# Patient Record
Sex: Male | Born: 1935 | Race: White | Hispanic: No | Marital: Married | Smoking: Never smoker
Health system: Southern US, Community
[De-identification: ages and names within clinical notes are randomized; demographics above are authoritative.]

## PROBLEM LIST (undated history)

## (undated) DIAGNOSIS — N12 Tubulo-interstitial nephritis, not specified as acute or chronic: Secondary | ICD-10-CM

## (undated) DIAGNOSIS — I639 Cerebral infarction, unspecified: Secondary | ICD-10-CM

## (undated) DIAGNOSIS — I2510 Atherosclerotic heart disease of native coronary artery without angina pectoris: Secondary | ICD-10-CM

## (undated) DIAGNOSIS — E785 Hyperlipidemia, unspecified: Secondary | ICD-10-CM

## (undated) DIAGNOSIS — C329 Malignant neoplasm of larynx, unspecified: Secondary | ICD-10-CM

## (undated) DIAGNOSIS — I10 Essential (primary) hypertension: Secondary | ICD-10-CM

## (undated) DIAGNOSIS — I358 Other nonrheumatic aortic valve disorders: Secondary | ICD-10-CM

## (undated) HISTORY — DX: Other nonrheumatic aortic valve disorders: I35.8

## (undated) HISTORY — DX: Hyperlipidemia, unspecified: E78.5

## (undated) HISTORY — DX: Atherosclerotic heart disease of native coronary artery without angina pectoris: I25.10

## (undated) HISTORY — PX: OTHER SURGICAL HISTORY: SHX169

## (undated) HISTORY — PX: CHOLECYSTECTOMY: SHX55

## (undated) HISTORY — DX: Tubulo-interstitial nephritis, not specified as acute or chronic: N12

## (undated) HISTORY — DX: Malignant neoplasm of larynx, unspecified: C32.9

## (undated) HISTORY — DX: Essential (primary) hypertension: I10

---

## 2001-02-15 ENCOUNTER — Encounter: Payer: Self-pay | Admitting: Internal Medicine

## 2001-02-15 ENCOUNTER — Ambulatory Visit (HOSPITAL_COMMUNITY): Admission: RE | Admit: 2001-02-15 | Discharge: 2001-02-15 | Payer: Self-pay | Admitting: Internal Medicine

## 2002-08-16 ENCOUNTER — Ambulatory Visit (HOSPITAL_COMMUNITY): Admission: RE | Admit: 2002-08-16 | Discharge: 2002-08-16 | Payer: Self-pay | Admitting: Internal Medicine

## 2002-08-16 ENCOUNTER — Encounter: Payer: Self-pay | Admitting: Internal Medicine

## 2002-08-18 ENCOUNTER — Encounter (HOSPITAL_COMMUNITY): Admission: RE | Admit: 2002-08-18 | Discharge: 2002-09-17 | Payer: Self-pay | Admitting: Internal Medicine

## 2002-08-18 ENCOUNTER — Encounter: Payer: Self-pay | Admitting: Internal Medicine

## 2002-10-20 ENCOUNTER — Encounter: Payer: Self-pay | Admitting: Internal Medicine

## 2002-10-20 ENCOUNTER — Ambulatory Visit (HOSPITAL_COMMUNITY): Admission: RE | Admit: 2002-10-20 | Discharge: 2002-10-20 | Payer: Self-pay | Admitting: Internal Medicine

## 2002-11-02 ENCOUNTER — Ambulatory Visit (HOSPITAL_COMMUNITY): Admission: RE | Admit: 2002-11-02 | Discharge: 2002-11-02 | Payer: Self-pay | Admitting: Internal Medicine

## 2003-05-15 ENCOUNTER — Encounter: Payer: Self-pay | Admitting: Emergency Medicine

## 2003-05-15 ENCOUNTER — Emergency Department (HOSPITAL_COMMUNITY): Admission: EM | Admit: 2003-05-15 | Discharge: 2003-05-15 | Payer: Self-pay | Admitting: Emergency Medicine

## 2003-10-19 ENCOUNTER — Encounter (HOSPITAL_COMMUNITY): Admission: RE | Admit: 2003-10-19 | Discharge: 2003-11-18 | Payer: Self-pay | Admitting: Cardiology

## 2003-10-22 ENCOUNTER — Ambulatory Visit (HOSPITAL_COMMUNITY): Admission: RE | Admit: 2003-10-22 | Discharge: 2003-10-22 | Payer: Self-pay | Admitting: Internal Medicine

## 2003-10-23 ENCOUNTER — Ambulatory Visit (HOSPITAL_COMMUNITY): Admission: RE | Admit: 2003-10-23 | Discharge: 2003-10-23 | Payer: Self-pay | Admitting: Internal Medicine

## 2003-11-14 ENCOUNTER — Observation Stay (HOSPITAL_COMMUNITY): Admission: RE | Admit: 2003-11-14 | Discharge: 2003-11-15 | Payer: Self-pay | Admitting: Internal Medicine

## 2006-09-14 DIAGNOSIS — I2510 Atherosclerotic heart disease of native coronary artery without angina pectoris: Secondary | ICD-10-CM

## 2006-09-14 HISTORY — DX: Atherosclerotic heart disease of native coronary artery without angina pectoris: I25.10

## 2007-07-19 ENCOUNTER — Ambulatory Visit: Payer: Self-pay | Admitting: Cardiology

## 2007-07-19 ENCOUNTER — Ambulatory Visit: Payer: Self-pay | Admitting: Cardiovascular Disease

## 2007-07-19 ENCOUNTER — Inpatient Hospital Stay (HOSPITAL_COMMUNITY): Admission: EM | Admit: 2007-07-19 | Discharge: 2007-07-21 | Payer: Self-pay | Admitting: Cardiovascular Disease

## 2007-07-20 ENCOUNTER — Encounter: Payer: Self-pay | Admitting: Cardiovascular Disease

## 2007-08-04 ENCOUNTER — Ambulatory Visit: Payer: Self-pay | Admitting: Cardiology

## 2007-08-25 ENCOUNTER — Encounter (HOSPITAL_COMMUNITY): Admission: RE | Admit: 2007-08-25 | Discharge: 2007-09-14 | Payer: Self-pay

## 2007-09-16 ENCOUNTER — Encounter (HOSPITAL_COMMUNITY): Admission: RE | Admit: 2007-09-16 | Discharge: 2007-10-16 | Payer: Self-pay | Admitting: Cardiology

## 2007-10-17 ENCOUNTER — Encounter (HOSPITAL_COMMUNITY): Admission: RE | Admit: 2007-10-17 | Discharge: 2007-11-16 | Payer: Self-pay | Admitting: Cardiology

## 2007-11-08 ENCOUNTER — Ambulatory Visit: Payer: Self-pay | Admitting: Cardiology

## 2007-11-17 ENCOUNTER — Encounter (HOSPITAL_COMMUNITY): Admission: RE | Admit: 2007-11-17 | Discharge: 2007-12-17 | Payer: Self-pay | Admitting: Cardiology

## 2008-01-31 ENCOUNTER — Ambulatory Visit: Payer: Self-pay | Admitting: Cardiology

## 2008-02-01 ENCOUNTER — Encounter: Payer: Self-pay | Admitting: Orthopedic Surgery

## 2008-02-01 ENCOUNTER — Ambulatory Visit (HOSPITAL_COMMUNITY): Admission: RE | Admit: 2008-02-01 | Discharge: 2008-02-01 | Payer: Self-pay | Admitting: Pulmonary Disease

## 2008-04-09 ENCOUNTER — Encounter: Payer: Self-pay | Admitting: Orthopedic Surgery

## 2008-04-09 ENCOUNTER — Ambulatory Visit (HOSPITAL_COMMUNITY): Admission: RE | Admit: 2008-04-09 | Discharge: 2008-04-09 | Payer: Self-pay | Admitting: Pulmonary Disease

## 2008-04-17 ENCOUNTER — Encounter: Payer: Self-pay | Admitting: Orthopedic Surgery

## 2008-04-26 ENCOUNTER — Ambulatory Visit: Payer: Self-pay | Admitting: Orthopedic Surgery

## 2008-04-26 DIAGNOSIS — M1710 Unilateral primary osteoarthritis, unspecified knee: Secondary | ICD-10-CM | POA: Insufficient documentation

## 2008-04-26 DIAGNOSIS — M23302 Other meniscus derangements, unspecified lateral meniscus, unspecified knee: Secondary | ICD-10-CM | POA: Insufficient documentation

## 2008-04-26 DIAGNOSIS — M25569 Pain in unspecified knee: Secondary | ICD-10-CM | POA: Insufficient documentation

## 2008-05-10 ENCOUNTER — Ambulatory Visit: Payer: Self-pay | Admitting: Orthopedic Surgery

## 2008-08-10 ENCOUNTER — Ambulatory Visit: Payer: Self-pay | Admitting: Cardiology

## 2008-09-10 ENCOUNTER — Encounter: Payer: Self-pay | Admitting: Cardiology

## 2008-09-10 LAB — CONVERTED CEMR LAB
ALT: 15 units/L
AST: 30 units/L
Alkaline Phosphatase: 60 units/L
CO2: 27 meq/L
Cholesterol: 170 mg/dL
HDL: 44 mg/dL
HDL: 44 mg/dL
LDL Cholesterol: 86 mg/dL
TSH: 1.7 microintl units/mL
Total Bilirubin: 0.8 mg/dL
Triglyceride fasting, serum: 150 mg/dL
Triglyceride fasting, serum: 150 mg/dL

## 2009-01-21 ENCOUNTER — Ambulatory Visit: Payer: Self-pay | Admitting: Cardiology

## 2009-01-22 ENCOUNTER — Encounter: Payer: Self-pay | Admitting: Cardiology

## 2009-01-22 LAB — CONVERTED CEMR LAB
Albumin: 4.4 g/dL
Alkaline Phosphatase: 60 units/L
BUN: 12 mg/dL
CO2: 27 meq/L
Creatinine, Ser: 0.85 mg/dL
MCV: 94 fL
Platelets: 212 10*3/uL
Total Bilirubin: 0.8 mg/dL
WBC: 6.8 10*3/uL

## 2009-01-24 ENCOUNTER — Ambulatory Visit (HOSPITAL_COMMUNITY): Admission: RE | Admit: 2009-01-24 | Discharge: 2009-01-24 | Payer: Self-pay | Admitting: Cardiology

## 2009-02-19 ENCOUNTER — Ambulatory Visit: Payer: Self-pay | Admitting: Cardiology

## 2009-02-19 ENCOUNTER — Encounter: Payer: Self-pay | Admitting: Cardiology

## 2009-05-21 ENCOUNTER — Ambulatory Visit (HOSPITAL_COMMUNITY): Admission: RE | Admit: 2009-05-21 | Discharge: 2009-05-21 | Payer: Self-pay | Admitting: Otolaryngology

## 2009-05-30 ENCOUNTER — Ambulatory Visit: Admission: RE | Admit: 2009-05-30 | Discharge: 2009-08-22 | Payer: Self-pay | Admitting: Radiation Oncology

## 2010-01-01 ENCOUNTER — Ambulatory Visit: Payer: Self-pay | Admitting: Orthopedic Surgery

## 2010-03-04 ENCOUNTER — Encounter: Admission: RE | Admit: 2010-03-04 | Discharge: 2010-03-04 | Payer: Self-pay | Admitting: General Surgery

## 2010-03-04 ENCOUNTER — Other Ambulatory Visit: Admission: RE | Admit: 2010-03-04 | Discharge: 2010-03-04 | Payer: Self-pay | Admitting: Interventional Radiology

## 2010-03-14 ENCOUNTER — Telehealth (INDEPENDENT_AMBULATORY_CARE_PROVIDER_SITE_OTHER): Payer: Self-pay

## 2010-04-14 ENCOUNTER — Ambulatory Visit: Payer: Self-pay | Admitting: Cardiology

## 2010-04-14 DIAGNOSIS — I2510 Atherosclerotic heart disease of native coronary artery without angina pectoris: Secondary | ICD-10-CM | POA: Insufficient documentation

## 2010-04-14 DIAGNOSIS — E785 Hyperlipidemia, unspecified: Secondary | ICD-10-CM

## 2010-10-14 NOTE — Progress Notes (Signed)
Summary: Refills  Phone Note Outgoing Call   Call placed by: Larita Fife Via LPN,  March 14, 1609 2:41 PM Action Taken: Appt scheduled Details for Reason: Refills Summary of Call: Pt. is due for f/u and needs refills. Pt. made an appt. for 04-14-10 with Dr. Dietrich Pates. RX for Isosorbide 30 day supply sent to [facility]. Initial call taken by: Larita Fife Via LPN,  March 15, 9603 2:43 PM

## 2010-10-14 NOTE — Assessment & Plan Note (Signed)
Summary: due for 1 yr f/u/tg   Visit Type:  Follow-up Primary Provider:  Dr.Edward [name]   History of Present Illness: [name] returns to the office for continuing assessment and treatment of coronary disease and cardiovascular risk factors.  Since I saw him one year ago, has done quite well from a cardiovascular standpoint.  Blood pressure typically runs approximately 150/80.  Lipids have not recently been assessed.  He has had no chest pain nor dyspnea.  Unfortunately, he was evaluated by Dr. 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  The primary tumor was excised and postoperative radiation treatments completed.  MRI Brain  Procedure date:  01/24/2009  Findings:      No acute abnormality Minor bilateral carotid atherosclerosis No significant abnormalities in the intracranial vessels  CXR  Procedure date:  07/29/2007  Findings:      Mild cardiomegaly Clear lung fields  Echocardiogram  Procedure date:  07/20/2007  Findings:      LV-normal size and function; mild LVH AV-mild thickening MV-mild regurgitation; mild annular calcification  -  Date:  09/10/2008    Cholesterol: 170    LDL: 86    HDL: 44    Triglycerides: 914    TSH: 1.7    BG Random: 36    BUN: 12    Creatinine: 0.85    Sodium: 140    Potassium: 4.4    Chloride: 101    CO2 Total: 27    SGOT (AST): 30    SGPT (ALT): 15    T. Bilirubin: 0.8    Alk Phos: 60    Calcium: 9.3    Total Protein: 7.2    Albumin: 4.4   Current Medications (verified): 1)  Aspirin Ec 325 Mg Tbec (Aspirin) .... Take One Tablet By Mouth Two Times A Day 2)  Isosorbide Dinitrate 10 Mg Tabs (Isosorbide Dinitrate) .... Take One  Tablet  By Mouth Three Times A Day 3)  Tylenol Arthritis Pain 650 Mg Cr-Tabs (Acetaminophen) .... As Needed 4)  Zantac 150 Mg Tabs (Ranitidine Hcl) .... Take 1 Tab Daily 5)  Potassium 99 Mg Tabs (Potassium) .... Take 1 Tab  Daily 6)  Daily Multi  Tabs (Multiple Vitamins-Minerals) .... Take 1 Tab Daily  Allergies (verified): 1)  ! * Ciprofloxacin 2)  ! * Pravastatin  Past History:  PMH, FH, and Social History reviewed and updated.  Past Medical History: ASCVD: Non-Q MI in 11/08; 40% mid LAD; 60% ostial diagonal; 40% RCA; 50% PDA; 95% posterolateral-DES Hypertension Hyperlipidemia Aortic valve sclerosis History of pyelonephritis Laryngeal carcinoma  Past Surgical History: Cholecystectomy-2005 Resection of laryngeal carcinoma-2010  Family History: Father died due to myocardial infarction at age 6 Mother died at age 6 following hip fracture; history of diabetes Siblings: One brother with CVA and MI; second required CABG surgery; third suffered myocardial infarction. 4 of 6 sisters are alive; one died due to leukemia and one unknown  Social History: Married with one adult child  Retired  Statistician excessive use  Review of Systems       See history of present illness.  Vital Signs:  Patient profile:   77 year old male Weight:      203 pounds BMI:     34.97 Pulse rate:   71 / minute BP sitting:   162 / 82  (right arm)  Vitals Entered By: Eddie Saa, CNA (April 14, 2010 2:45 PM)  Physical Exam  General:  Mildly overweight; well developed; no acute distress; mildly hoarse voice Weight-203, 10 pounds less than last year Neck-No JVD; no carotid bruits: Lungs-No tachypnea, no rales; no rhonchi; no wheezes: Cardiovascular-normal PMI; normal S1 and S2; fourth heart sound present Abdomen-BS normal; soft and non-tender without masses or organomegaly:  Musculoskeletal-No deformities, no cyanosis or clubbing: Neurologic-Normal cranial nerves; symmetric strength and tone:  Skin-Warm, no significant lesions: Extremities-Nl distal pulses; no edema:     Impression & Recommendations:  Problem # 1:  ATHEROSCLEROTIC CARDIOVASCULAR DISEASE (ICD-429.2) Patient is currently  asymptomatic.  In the absence of my ability to modify medical therapy, no additional intervention is warranted.  Problem # 2:  HYPERLIPIDEMIA (ICD-272.4) Patient has not tolerated lipid-lowering medications in the past and is unwilling to try any additional agents.  Problem # 3:  HYPERTENSION (ICD-401.1) Blood pressure is mildly elevated at this office visit and at home as well by patient report.  In the past, blood pressure on repeat measurement was improved, but not today.  He appears to have developed significant hypertension, but is unwilling to attempt pharmacologic treatment.  I emphasized the importance of seeking medical attention immediately should recurrent symptoms suggestive of myocardial ischemia occur.  Since Mr. Kollmann is unwilling to modify his medical regime, management of his coronary disease has only one option-percutaneous or surgical intervention should additional ischemia or infarction occur.

## 2010-10-14 NOTE — Assessment & Plan Note (Signed)
Summary: CHECK LT KNEE/MEDICARE,MUT OF OM/CAF   Visit Type:  new problem  CC:  pain swelling and LEFT knee.  History of Present Illness: pain swelling, LEFT knee.  77 year old male with history of injury to his LEFT knee twisting while playing tennis several years ago. He received one injection from his primary care physician did seem to help.  He does complain of medial knee pain, pain with twisting, inability to squat.    history of cancer, treated with radiation.  Exam shows he does not have a limp.  His knee does not have an effusion. He has medial joint line pain. He has 125 of flexion with pain on terminal flexion is positive Murray. Sign with a click.  Neurovascular exam intact.  Injected LEFT knee.  Follow up as needed     Allergies: No Known Drug Allergies   Other Orders: Est. Patient Level II (44010) Depo- Medrol 40mg  (J1030) Joint Aspirate / Injection, Large (27253)

## 2010-11-17 ENCOUNTER — Telehealth (INDEPENDENT_AMBULATORY_CARE_PROVIDER_SITE_OTHER): Payer: Self-pay | Admitting: *Deleted

## 2010-11-25 NOTE — Progress Notes (Signed)
Summary: PT IS OUT OF MEDICATION NEEDS CALLED IN TODAY  Phone Note Call from Patient Call back at Home Phone [phone]   Caller: PT Reason for Call: Talk to Nurse Summary of Call: PT NEED ISOSORIBIDE CALLED IN TO [facility], [facility] STATES THAT THEY HAVE FAXED THIS TO USE TWICE. Initial call taken by: Faythe Ghee,  November 17, 2010 10:11 AM    Prescriptions: ISOSORBIDE DINITRATE 10 MG TABS (ISOSORBIDE DINITRATE) Take one  tablet  by mouth three times a day  #90 x 1   Entered by:   Teressa Lower RN   Authorized by:   Kathlen Brunswick, MD, [facility]   Signed by:   Teressa Lower RN on 11/17/2010   Method used:   Electronically to        [facility]  [address] (retail)       [address]       [place], [place]  [address]       Ph: [phone]       Fax: [phone]   RxID:   [id number]

## 2011-01-15 ENCOUNTER — Other Ambulatory Visit: Payer: Self-pay | Admitting: Cardiology

## 2011-01-27 NOTE — Letter (Signed)
February 19, 2009    Eddie L. [name], MD  [address]  [place], [place] [address]   RE:  [name], [name]  MRN:  [id number]  /  DOB:  08/31/1929   Dear [name]:   [name] returns to the office for continued assessment and treatment  of coronary artery disease and possible cerebrovascular disease.  Since  his last visit, he has done fine.  He took Aggrenox for a while, but  subsequently felt week with diarrhea and nausea prompting him to  discontinue that medication.  After all has been set and done, he is  back on the medicine that Dr. Pollyann Glen put him on in [place] decades  ago, specifically aspirin 325 mg b.i.d. and Sorbitrate 10 mg t.i.d.  He  feels that he is somewhat less energetic and more fatigued than he was  in the past, but he attributes this to aging.   His MRI/MRA study was quite good.  There were some minor atherosclerotic  changes in the carotids that were thought to be age appropriate.  He had  no evidence of previous CVA.   PHYSICAL EXAMINATION:  GENERAL:  Pleasant gentleman, somewhat apologetic  about stopping all of his medications, but in no acute distress.  VITAL SIGNS:  The weight is 213, 3 pounds more than in May.  Blood  pressure 110/80, heart rate 65 and regular, respirations 12 and  unlabored.  NECK:  No jugular venous distention; no carotid bruits.  LUNGS:  Clear.  CARDIAC:  Normal first and second heart sounds; fourth heart sound  present.  ABDOMEN:  Soft and nontender; no bruits.  EXTREMITIES:  No edema.   IMPRESSION:  [name] appears to be delivering the message that he  will tolerate no medication.  His lipid profile off medication is not  that bad with a total cholesterol of 170.  I have explained the  rationale for all these medicines and the fact that they do not provide  symptomatic benefit, but decrease his likelihood of future events.  I  have explained the importance that he seek immediate treatment when he  experiences symptoms that  could represent one of those events.  I will  plan to see this nice gentleman again in 1 year.    Sincerely,      Eddie Friends. Dietrich Pates, MD, [facility]  Electronically Signed    RMR/MedQ  DD: 02/19/2009  DT: 02/20/2009  Job #: [id number]

## 2011-01-27 NOTE — Cardiovascular Report (Signed)
[name], [name]              ACCOUNT NO.:  [id number]   MEDICAL RECORD NO.:  [id number]          PATIENT TYPE:  INP   LOCATION:  6529                         FACILITY:  [facility]   PHYSICIAN:  Veverly Fells. Excell Seltzer, MD  DATE OF BIRTH:  1929/04/02   DATE OF PROCEDURE:  07/19/2007  DATE OF DISCHARGE:                            CARDIAC CATHETERIZATION   PROCEDURE:  Left heart catheterization, selective coronary angiography,  left ventricular angiography, PTCA and drug-eluting stent placement in  the right posterolateral branch and StarClose of the right femoral  artery.   INDICATION:  [name] is a 77 year old gentleman who presented with an  acute coronary syndrome.  He ruled in for subendocardial MI.  He was  started on heparin and Integrilin early this morning but has continued  to have stuttering chest pain and was brought for cardiac  catheterization.   PROCEDURE IN DETAIL:  Risks and indications of the procedure were  reviewed with the patient.  Informed consent was obtained.  The right  groin was prepped, draped, anesthetized with 1% lidocaine using modified  Seldinger technique.  A 6 French sheath was placed in the right femoral  artery.  Multiple views of the left and right coronary arteries were  taken using standard 6 French Judkins catheters.  Following selective  coronary angiography an angled pigtail catheter was inserted into the  left ventricle and pressures were recorded.  Left ventriculogram was  performed and a pullback across the aortic valve was done.   At the conclusion of the diagnostic procedure I elected to intervene on  the right posterolateral branch.  This was a large branch with a 95-99%  stenosis.  Otherwise [name] had diffuse plaque and nonobstructive  disease but this was the only critical lesion seen.  Heparin and  Integrilin were continued for anticoagulation.  Additional 3000 units of  heparin was given and once a therapeutic ACT was achieved a  6 French RCV  guide catheter was inserted into the right coronary artery.  A Cougar  guidewire was passed easily beyond the area of severe stenosis out into  the distal posterolateral branch.  The lesion was predilated on multiple  inflations with a 2.5 x 12 mm Maverick balloon.  The balloon was well  expanded and appeared to cover the lesion well.  The balloon was dilated  to 8 atmospheres.  Following balloon dilatation there was improvement in  the percent stenosis and there was persistent TIMI III flow.  I elected  to stent the vessel with a 2.75 x 15 mm Promus stent which was deployed  at 16 atmospheres.  The stent was well expanded.  There was TIMI III  flow following stenting.  The stent was then postdilated with a 3.0 x 12  mm Quantum Maverick balloon to 20 atmospheres on two inflations.  This  covered the entire stented segment.  At completion of the procedure  there was zero percent residual stenosis and TIMI III flow throughout.  The stent was widely expanded.  A StarClose device was then used to seal  the femoral arteriotomy.  All catheter exchanges were  performed over a  guidewire.  There were no immediate complications.   FINDINGS:  Aortic pressure 137/72 with mean of 101, left ventricular  pressure is 137/22.   The left main coronary artery has very minor luminal irregularity.  It  is widely patent and bifurcates into the LAD and left circumflex.   The LAD is a large caliber vessel that courses down and wraps around the  left ventricular apex.  There is a large first diagonal branch and a  medium sized second diagonal branch present.  The proximal LAD has 30%  stenosis in the mid LAD at the origin of the first septal perforator and  first diagonal branch has a 30-40% stenosis.  The first diagonal branch  has a 60% ostial stenosis.  There is TIMI III flow throughout the LAD.  There are no other significant stenoses throughout the mid and distal  portions of the LAD.    The left circumflex is widely patent.  It is a large caliber vessel that  courses down the AV groove.  After the first OM the vessel tapers to a  medium size vessel.  There is no significant angiographic stenosis  throughout the left circumflex.  There is a tiny intermediate branch  with a dye stain that may be subtotally occluded.  This appears to be a  very small branch vessel.  The first OM branch is large caliber and  essentially supplies an OM1 and OM2 territory as it bifurcates into twin  vessels.  There are minor luminal irregularities but no significant  stenoses throughout the left circumflex or its branch vessels.   The right coronary artery is diffusely diseased.  It has an inferior  origin and the mid portion of the vessel has diffuse 30-40% stenoses.  After a medium sized RV marginal branch there are diffuse luminal  irregularities throughout the distal portion of the RCA.  The RCA  divides into a large posterolateral branch as well as a large PDA  branch.  The PDA has minor disease in the mid portion and approximately  50% stenosis.  The posterolateral branch has a severe 95% stenosis and  otherwise has minor luminal irregularities.  The 95% stenosis is located  in the mid portion of the posterolateral branch.   Left ventriculography.  The left ventricular function is poorly  visualized overall.  The base of the left ventricle appears normal.  The  apical portions are not well seen.  Overall the LV function appears to  be grossly normal.   ASSESSMENT:  1. Nonobstructive LAD (left anterior descending) and left circumflex      stenosis.  2. Severe right posterolateral stenosis with successful PCI      (percutaneous coronary intervention) using a Promus drug-eluting      stent.  3. Probable normal left ventricular function.   PLAN:  The patient should remain on dual antiplatelet therapy with  aspirin and Plavix for a minimum of 12 months.  Will check an  echocardiogram  to better assess his LV function.  We will cycle cardiac  enzymes in the setting of his non-ST MI and he should be treated with  aggressive medical therapy in the setting of his diffuse coronary artery  disease.      Veverly Fells. Excell Seltzer, MD  Electronically Signed     MDC/MEDQ  D:  07/19/2007  T:  07/20/2007  Job:  [id number]

## 2011-01-27 NOTE — Letter (Signed)
November 08, 2007    [name], M.D.  [address]  [place], [place] [address]   RE:  [name], [name]  MRN:  [id number]  /  DOB:  1929-01-28   Dear [name]:   [name] returns to the office for continued assessment and treatment  of coronary disease and cardiovascular risk factors.  Since his last  visit, he has done generally well.  He reports symptoms consistent with  restless leg syndrome when he retires at night, but these do not disturb  his sleep.  He has some leg discomfort and feels that his extremities  are cold.  His exercise tolerance is good.  He has had no chest  discomfort nor dyspnea.   Current medications include clopidogrel 75 mg daily, metoprolol 25 mg  b.i.d., aspirin 325 mg daily, and pravastatin 20 mg daily.  He was  treated previously treated with isosorbide dinitrate and would like to  resume this medication.   EXAM:  Pleasant, well-appearing gentleman in no acute distress.  The weight is 206, 2 pounds more than in November 2008.  Blood pressure  120/70, heart rate 45 and regular, respirations 18.  NECK:  No jugular venous distention; normal carotid upstrokes without  bruits.  SKIN:  Maculopapular rash over the forehead.  LUNGS:  Clear.  CARDIAC:  Grade 2/6 - 3/6 systolic ejection murmur at the cardiac base.  ABDOMEN:  Soft and nontender; normal bowel sounds; no bruits; no  organomegaly.  EXTREMITIES:  No edema; normal distal pulses.   EKG:  Sinus bradycardia; prominent voltage; delayed R-wave progression;  nondiagnostic inferior Q-waves; J-point elevation; lateral T-wave  inversion more prominent than on a prior tracing of July 20, 2007.   IMPRESSION:  [name] is doing well from a symptomatic standpoint.  Blood pressure control is excellent.  Lipids were checked last month and  were also excellent.  He has asymptomatic sinus bradycardia.  Metoprolol  will be discontinued.  Isosorbide will be resumed at a dose of 10 mg  t.i.d.  He is  concerned about pravastatin.  The dose will be decreased  to 10 mg daily.  I will plan see this nice gentleman again in 7 months.    Sincerely,      Eddie Friends. Dietrich Pates, MD, [facility]  Electronically Signed    RMR/MedQ  DD: 11/08/2007  DT: 11/09/2007  Job #: [id number]

## 2011-01-27 NOTE — Assessment & Plan Note (Signed)
[name]                       Eddie CARDIOLOGY OFFICE NOTE   Warren, [name]                     MRN:          [id number]  DATE:01/21/2009                            DOB:          11/12/1928    CARDIOLOGIST:  Dr. Dietrich Pates.   PRIMARY CARE PHYSICIAN:  Dr. Shaune Pollack.   REASON FOR VISIT:  Six-month follow-up.   HISTORY OF PRESENT ILLNESS:  [name] is a 77 year old male patient  with a history of coronary disease status post non-ST-elevation  myocardial infarction November 2008 treated with a Promus drug-eluting  stent to the posterolateral branch of RCA who has residual moderate  nonobstructive disease.  I last saw him in November 2009.  The patient  complained of significant myalgias.  He has stopped his pravastatin.  He  also had stopped his Plavix, but I asked him to restart this.  He called  back several weeks later with continued symptoms of myalgias and  arthralgias with his Plavix.  After further review with the  cardiologist, I had the patient stop his Plavix altogether as it had  been more than 12 months since his intervention.  He had been asked to  increase his aspirin to 325 mg a day at that time.  He is generally done  well up until yesterday.  He denies any history of exertional chest pain  or shortness of breath.  He denies orthopnea, PND or pedal edema.  Last  night, he describes symptoms of right arm weakness and numbness with  associated speech difficulties.  His wife says that he would try to say  one thing, but something different was spoken.  He denied any visual  symptoms.  His symptoms lasted about 15 minutes and subsided on their  own.  Today, he feels well.   MEDICATIONS:  1. Multivitamin daily.  2. Aspirin 325 mg daily.  3. Isosorbide 5 mg 3 times a day 4 tablets p.r.n.  4. Nitroglycerin p.r.n.   ALLERGIES AND INTOLERANCES:  Zocor has called caused myalgias in the  past as well as pravastatin.  He has  had problems with significant  hypotension and side effects to lisinopril and Norvasc.  He is  apparently had significant bradycardia with beta-blockers in the past as  well.   SOCIAL HISTORY:  Is a nonsmoker.   REVIEW OF SYSTEMS:  Please see HPI.  Denies any fevers, chills, cough,  melena, hematochezia, hematuria, dysuria.  All other systems reviewed  and negative.   PHYSICAL EXAMINATION:  He is a well-nourished, well-developed male.  Blood pressure is 140/68, pulse 58, weight 210 pounds.  HEENT:  Normal.  NECK:  Without JVD.  LYMPH:  Without lymphadenopathy.  ENDOCRINE:  Without thyromegaly.  CARDIAC:  Normal S1-S2.  Regular rate and rhythm with a short 1/6  systolic ejection murmur heard best along the left sternal border.  LUNGS:  Clear to auscultation bilaterally.  No wheezing or rales.  ABDOMEN:  Soft, nontender.  EXTREMITIES:  Without edema.  NEUROLOGIC:  He is alert and oriented x3.  Cranial nerves II-XII grossly  intact.  SKIN:  Warm and dry.  VASCULAR  EXAM:  No carotid bruits noted bilaterally.   Electrocardiogram revealed sinus rhythm with a heart rate of 80, normal  axis, PACs.  No significant change when compared to previous tracing.   ASSESSMENT AND PLAN:  1. Transient neurologic symptoms concerning for transient ischemic      attack.  I discussed the case further with Dr. Dietrich Pates today.  We      will arrange MRI and MRA of his brain and neck.  Since he cannot      take Plavix, he will be placed on Aggrenox 1 tablet b.i.d.  In      addition, he will be asked to take 81 mg of aspirin for      antiplatelet effect given his history of PCI.  I will also be in      touch with Dr. Juanetta Warren regarding referral to neurology.  He will      have blood work to include a CBC, CMET and TSH.  He will be brought      back in several weeks to follow up with Dr. Dietrich Pates.  2. Dyslipidemia.  He requires further management of his lipids.      However, he has had significant side  effects in the past.  We will      proceed with workup of his recent possible TIA initially.  When he      is seen back in follow-up, we can certainly discuss possibly      placing him on low-dose Crestor 3 to 4 days a week.  3. Hypertension.  This needs better control.  He has been intolerant      to several medications in the past.  As noted above, he will      undergo further workup of possible TIA.  When he is seen back in      follow-up, we can certainly consider adding low-dose thiazide      diuretic to his regimen to control as blood pressure.  4. Coronary artery disease.  He is not having any symptoms of angina      at this time.   DISPOSITION:  He will be brought back in follow-up with Dr. Dietrich Pates in  3 to 4 weeks or sooner p.r.n.     [name], PA-C  Electronically Signed      Gerrit Friends. Dietrich Pates, MD, [facility]  Electronically Signed   SW/MedQ  DD: 01/21/2009  DT: 01/21/2009  Job #: [id number]   cc:   [name], M.D.

## 2011-01-27 NOTE — Discharge Summary (Signed)
NAMEJAXZEN, [name]              ACCOUNT NO.:  [id number]   MEDICAL RECORD NO.:  [id number]          PATIENT TYPE:  INP   LOCATION:  6529                         FACILITY:  [facility]   PHYSICIAN:  Gerrit Friends. Dietrich Pates, MD, FACCDATE OF BIRTH:  04-26-1929   DATE OF ADMISSION:  07/19/2007  DATE OF DISCHARGE:  07/21/2007                               DISCHARGE SUMMARY   PRIMARY CARDIOLOGIST:  Dr. Kari Baars   DISCHARGE DIAGNOSIS:  1. Non-ST-elevation myocardial infarction.  2. Coronary artery disease.  3. Hyperlipidemia  4. Status post cholecystectomy 2005.   ALLERGIES:  CIPROFLOXACIN.   PROCEDURES:  1. Left heart cardiac catheterization with successful percutaneous      coronary intervention and stenting of the right posterolateral vein      (RPLV) with placement of a 2.75 x 15-mm Promise drug-eluting stent.  2. A 2-D echocardiogram.   HISTORY OF PRESENT ILLNESS:  This is a 77 year old male with a prior  history of coronary artery disease, who has been medically managed and  doing well over the years.  He was in his usual state of health until  the afternoon of July 19, 2007, he began to note exertional chest  pressure, relieved with rest and subsequently with nitroglycerin.  Because of ongoing symptoms with progressive severity, he had presented  to [facility] where a first set of cardiac markers and ECG were  unremarkable.  He was transferred to [facility] for further evaluation.   HOSPITAL COURSE:  [name] ruled in for non-ST-elevation MI with a  peak CK of 383, MB of 51.7 and troponin I of 7.31.  He was placed on  Integrilin therapy and underwent left heart cardiac catheterization on  November 4, revealing a 95% stenosis in the proximal right  posterolateral branch of the right coronary artery.  He otherwise had  nonobstructive disease and normal LV function.  The RPL was successfully  stented with a 2.75 x 15-mm Promise drug-eluting stent.  The patient  tolerated this procedure well and postprocedure was monitored in the ICU  prior to being transferred to telemetry.  He underwent a 2-D  echocardiogram on November 5 and this showed an EF of 55% to 60% without  regional wall motion abnormalities.  There was mild mitral  regurgitation.  [name] has been ambulating without recurrent  symptoms or limitations.  He has been maintained on aspirin and beta  blocker and Plavix therapy.  Notably, [name] was previously on  simvastatin at home.  He is unsure of the dose.  Unfortunately, he had  myalgias with that dose of simvastatin and we have initiated Lipitor 80  mg nightly here.  However, we are concerned that he may not tolerate  this dose once at home.  We have, therefore, recommended to him to use  1/2 tablet of the simvastatin that he has at home and we will follow up  with him as an outpatient in [place].  [name] is otherwise doing  and well be discharged today in satisfactory condition.   DISCHARGE LABS:  Hemoglobin 13.4, hematocrit 38.6, WBC 7.2, platelets  183,000, MCV 96.6, sodium 38, potassium 3.5, chloride 100, CH 28, BUN  17, creatinine 0.94, glucose 77, INR 1.1, CK 164, MB 5.6.  His peak  troponin-I was 7.34, total bilirubin 1.6, alkaline phosphatase 57, AST  74, ALT 21, albumin 3.4, calcium 8.8, magnesium 1.9, total cholesterol  145, triglycerides 82, HDL 32, LDL 97, TSH 2.202.   DISPOSITION:  The patient is being discharged home today in good  condition.   FOLLOW-UP APPOINTMENTS:  1. He is to follow up with Dr. Mableton Bing on November 20 at 2:30      p.m..  2. He is to follow up with Dr. [name] as previously scheduled.   DISCHARGE MEDICATIONS:  1. Aspirin 325 mg daily.  2. Plavix 75 mg daily.  3. Simvastatin 1/2 tablet of his current home dose (he is unsure the      dose).  4. Lopressor 25 mg b.i.d.  5. Nitroglycerin 0.4 mg sublingual p.r.n. chest pain.   OUTSTANDING LAB STUDIES:  None.   DURATION OF  DISCHARGE ENCOUNTER:  45 minutes including admission time.      Nicolasa Ducking, ANP      Gerrit Friends. Dietrich Pates, MD, [facility]  Electronically Signed    CB/MEDQ  D:  07/21/2007  T:  07/21/2007  Job:  [id number]   cc:   Ramon Dredge L. [name], M.D.

## 2011-01-27 NOTE — Letter (Signed)
August 04, 2007    Ramon Dredge L. [name], M.D.  [address]  [place], [place] [address]   RE:  DANTAE, MEUNIER  MRN:  [id number]  /  DOB:  [birth year]   Dear [name]:   Mr. Esson return to the office for continued assessment and treatment  of coronary disease and cardiovascular risk factors.  He has done well  since discharge following urgent angioplasty for unstable angina.  A  drug-eluting stent was placed.  I emphasized the importance of taking  clopidogrel continuously.  He suffered an episode of epistaxis that  eventually resolved with pressure.  He has a history of hyperlipidemia  but has been unable to tolerate simvastatin at high dose in the past.  I  gave him low dose on discharge from the hospital, but he has stopped  that prescription due to myalgias.  There is a history of hypertension,  but blood pressure has been good for many years.   CURRENT MEDICATIONS:  1. Clopidogrel 75 mg daily.  2. Metoprolol 25 mg b.i.d.  3. Multivitamin.  4. Aspirin 325 mg daily.   PHYSICAL EXAMINATION:  A pleasant gentleman in no acute distress.  Weight is 204, 8 pounds more than in 2005.  Blood pressure 125/70.  Heart rate 55 and regular.  Respirations 18.  NECK:  No jugular venous distention; no carotid bruits.  LUNGS:  Clear.  CARDIAC:  Normal first and second heart sounds; fourth heart sound  present.  ABDOMEN:  Soft and nontender; no bruits; no organomegaly.  EXTREMITIES:  No edema.  Normal distal pulses; catheterization site  benign.   IMPRESSION:  Mr. Denardo is doing well from a symptomatic standpoint.  He  is experiencing coolness in his upper and lower extremities.  This might  be due to metoprolol.  He will persist with the drug to determine  whether those symptoms will resolve.  We will start pravastatin and  reduce the dose until he tolerates it or until it is clear that he will  not tolerate a statin.  I will check a lipid profile and chemistry  profile in two months and plan  to see this nice gentleman again in three  months.    Sincerely,      Gerrit Friends. Dietrich Pates, MD, [facility]  Electronically Signed    RMR/MedQ  DD: 08/04/2007  DT: 08/05/2007  Job #: [id number]

## 2011-01-27 NOTE — Assessment & Plan Note (Signed)
[facility]                       [facility] OFFICE NOTE   [name], [name]                     MRN:          [id number]  DATE:08/10/2008                            DOB:          1929-01-27    CARDIOLOGIST:  Gerrit Friends. Dietrich Pates, MD, [facility]   PRIMARY CARE PHYSICIAN:  Oneal Deputy. [name], MD   REASON FOR VISIT:  Six-month followup.   HISTORY OF PRESENT ILLNESS:  [name] is a 77 year old male patient  with a history of coronary artery disease, status post non-ST-elevation  myocardial infarction in November 2008, treated with a PROMUS drug-  eluting stent to the posterolateral branch of the RCA.  At the time of  his catheterization, he had mild-to-moderate residual nonobstructive  CAD.  Specifically, he had a proximal LAD lesion of 30%, and a mid  lesion of 30-40%, first diagonal with 60% ostial stenosis.  His  circumflex had a tiny intermediate branch that looked to be subtotally  occluded.  This was very small.  He had a 30-40% stenosis in the  midportion of the RCA and 50% stenosis in the PDA.   The patient returns for followup today.  He has overall been doing well.  However, he has had some problems with what sounds like myalgias.  He  has had significant lower extremity pain.  He thought this was related  to Plavix and pravastatin and he stopped them both about 2 weeks ago.  Since that time, his lower extremity pain has improved.  He still has  some arthralgias bilaterally.  Most notably his right ankle and left  knee has been bothering him.  These seem to be somewhat stiff and  painful.  They are better with increased movement.  He denies any  recurrence of his angina.  He occasionally has some mild chest  discomfort that seems to possibly be related to meals, although he is  unsure about this.  He denies any exertional chest heaviness or  tightness.  Denies any significant shortness of breath with exertion.  Denies any syncope,  orthopnea, or PND.   MEDICATIONS:  1. Plavix 75 mg daily - recently stopped.  2. Multivitamin daily.  3. Aspirin 325 mg daily.  4. Pravastatin 10 mg daily - recently stopped.  5. Isosorbide 5 mg 3 times a day.  6. Nitroglycerin p.r.n. chest pain.   PHYSICAL EXAMINATION:  GENERAL:  He is a well-nourished, well developed  man in no acute distress.  VITAL SIGNS:  Blood pressure is 159/82, pulse 87, and weight 212 pounds.  HEENT:  Normal.  NECK:  Without JVD.  CARDIAC:  S1 and S2.  Regular rate and rhythm without 1/6 systolic  ejection murmur best heard at the left sternal border.  LUNGS:  Clear to auscultation bilaterally.  ABDOMEN:  Soft and nontender.  EXTREMITIES:  Without edema.  NEUROLOGIC:  He is alert and oriented x3.  Cranial nerves II through XII  are grossly intact.   EKG reveals sinus rhythm at heart rate of 84, normal axis, nonspecific  ST-T wave changes and occasional PVCs.   ASSESSMENT AND PLAN:  1. Coronary artery  disease, status post non-ST-elevation myocardial      infarction in November 2008, treated with a PROMUS drug-eluting      stent to the postero-lateral artery with mild-to-moderate      nonobstructive coronary artery disease in the left anterior      descending and circumflex and overall preserved left ventricular      function with an ejection fraction of 55-60% by echocardiogram in      November 2008.  The patient recently stopped his Plavix secondary      to myalgias.  He also stopped his pravastatin.  I explained to him      that his myalgias were most likely secondary to his statin      medication and not his Plavix.  We had a long discussion today      about the reasons why Plavix should be continued, including stent      thrombosis.  He understands this and is in agreement to restarting      his Plavix.  He can go back to 81 mg a day of aspirin.  He prefers      to continue on his isosorbide and will continue as such.  2. Myalgias.  As outlined  above.  He will restart his Plavix.  I have      asked him to stay off of his pravastatin for now.  3. Dyslipidemia.  We will recheck lipids in about 4 weeks.  I would      suggest possibly trying him on Crestor at 5 or 10 mg a day,      possibly 3-4 times a week to see if this would alleviate problems      with myalgias.  We will see what his lipids show when he has this      checked.  4. Hypertension.  His blood pressure is somewhat uncontrolled today.      He had Lopressor discontinued secondary to significant bradycardia      several months ago.  His last several visits have demonstrated      optimal blood pressures.  The patient was somewhat upset today      regarding his symptoms with myalgias.  We will have the patient      follow up with a blood pressure check at the time of his relook lab      work.   DISPOSITION:  The patient will be brought back in routine followup with  either myself or Dr. Dietrich Pates in the next 6 months or sooner p.r.n.     [name], PA-C  Electronically Signed      Gerrit Friends. Dietrich Pates, MD, [facility]  Electronically Signed   SW/MedQ  DD: 08/10/2008  DT: 08/10/2008  Job #: [id number]   cc:   Ramon Dredge L. [name], M.D.

## 2011-01-27 NOTE — Letter (Signed)
Jan 31, 2008    Edward L. [name], M.D.  [address]  [place], [place] [address]   RE:  TRAPPER, MEECH  MRN:  [id number]  /  DOB:  [birth year]   Dear [name]:   Mr. Christiano returns to the office for continued assessment and treatment  of coronary disease, now 6 months following a drug-eluting stent  placement in the right coronary artery.  He has no cardiac symptoms,  specifically denied chest discomfort or dyspnea.  [facility]  and plans to play softball.  His issues have been some myalgias that he  thinks may be related to pravastatin.  These have not prompted him to  consider discontinuing the medication.  He has had substantial bruising  and frequent episodes of fairly mild epistaxis.  He decreased his dose  of isosorbide to 5 mg t.i.d. thinking that this would help.  It has not.  He has followed blood pressures at home, approximately 50% of which have  been elevated above 140 systolic.   CURRENT MEDICATIONS:  1. Clopidogrel 75 mg daily.  2. Aspirin 325 mg daily.  3. Pravastatin 10 mg daily.  4. Isosorbide 5 mg t.i.d.   PHYSICAL EXAMINATION:  On exam, a pleasant gentleman in no acute  distress.  Weight 206, stable.  Blood pressure 125/70, heart rate 65 and regular,  respirations 14.  SKIN:  Mild papules over the forehead with erythema, perhaps  representing seborrhea.  NECK:  No jugular venous distension, no carotid bruits.  LUNGS:  Clear.  CARDIAC:  Fourth heart sounds and modest systolic ejection murmur.  Normal first and second heart sounds.  ABDOMEN:  Soft and nontender; no organomegaly.  EXTREMITIES:  Distal pulses intact; no edema.   IMPRESSION:  Mr. Gunderman is doing generally well.  We discussed the  possibility of dermatology visit and stronger steroids for his skin  eruption.  He will defer this for now.  We discussed the possibility of  ears, nose, and throat evaluation for his epistaxis.  He will defer this  also.  I suggested he decrease aspirin to 81  mg daily.  We could not  stop clopidogrel which was emphasized to him.  Blood pressure control is  suboptimal.  He will bring his blood pressure device at home to check  with ours.  If accurate, he will start amlodipine 2.5 mg daily and  return to see the cardiology nurses in 1 month for blood pressure  reevaluation.  I will see him again in 6 months.    Sincerely,      Gerrit Friends. Dietrich Pates, MD, [facility]  Electronically Signed    RMR/MedQ  DD: 01/31/2008  DT: 01/31/2008  Job #: [id number]

## 2011-01-27 NOTE — H&P (Signed)
[name], SPINNATO NO.:  [id number]   MEDICAL RECORD NO.:  [id number]          PATIENT TYPE:  INP   LOCATION:  2901                         FACILITY:  [facility]   PHYSICIAN:  Christell Faith, MD   DATE OF BIRTH:  [birth year]   DATE OF ADMISSION:  07/19/2007  DATE OF DISCHARGE:                              HISTORY & PHYSICAL   The patient is a new patient for [facility] being admitted to  Dr. Charlton Haws.  The patient is being transferred from [facility] for unstable angina.   CHIEF COMPLAINT:  Chest tightness.   HISTORY OF PRESENT ILLNESS:  The patient is a 77 year old white man who  reports a remote history of coronary artery disease, diagnosed by Dr.  Pollyann Glen at [facility] 30 years ago.  The patient feels  certain that he did not undergo a heart catheterization but reports with  equal certainty that he has a 70% blockage in the artery down the front  of his heart.  Regardless, he has basically had no chest pain since then  and had a negative Myoview test in February 2005 prior to elective  cholecystectomy.   Today he was doing routine chores such as vacuuming and loading and  unloading wood.  Around lunchtime he began noticing chest tightness with  exertion, and so he stopped to rest and the pain resolved.  He then  tried to exert himself again and the pain came on with more severity, so  he took nitroglycerin, and, again, the pain resolved.  This pattern  continued throughout the afternoon until finally it intensified to  approximately 7/10, and he went to [facility] Department where  first set of cardiac enzymes were negative and electrocardiogram was  unremarkable.  He was given aspirin and heparin and transferred to [facility] where he complains of a nagging 1/10 chest discomfort.  He does  report that he exercises regularly, both at the [facility] and by taking  walks, and that he has not been telling his wife, but he  has been  experiencing increasing amounts of chest tightness with his daily  exertion.   PAST MEDICAL HISTORY:  1. Reportedly had angina treated at [facility] 30 years ago and      was told that he had obstructive coronary artery disease, although      does not remember having a heart catheterization.  2. Cholecystectomy, 2005.  3. Negative Myoview prior to cholecystectomy with no ischemia and      normal ejection fraction.   SOCIAL HISTORY:  He has never smoked.  He is a retired Nurse, adult.  He is  married.  Lives in [place].   FAMILY HISTORY:  Mother died at old age.  Father died with an MI at age  44.  Brother died of a heart condition at age 31, and 2 other brothers  had open heart surgery.   ALLERGIES:  CIPROFLOXACIN.   MEDICINES:  1. Aspirin 81 mg p.o. every day.  2. Nitroglycerin sublingual as needed.   REVIEW OF SYSTEMS:  Positive for exertional angina  and positive for  exertional dyspnea.  The balance of 12 systems is reviewed in detail and  is negative except for what is described above.   PHYSICAL EXAM:  VITAL SIGNS:  Blood pressure 137/70, pulse 63,  respiratory rate 14, saturation 99% on room air.  GENERAL:  This is a very pleasant older white man in no distress.  HEENT:  Pupils are round and reactive.  Sclerae are clear.  Extraocular  movements are intact.  Mucous membranes are moist.  NECK:  Supple.  No lymphadenopathy, no thyromegaly.  Neck veins are  flat.  No carotid bruits.  LUNGS:  Clear to auscultation bilaterally without wheezing or rales.  CARDIAC EXAM:  Normal rate, regular rhythm, no murmurs or gallops.  ABDOMEN:  Soft, nontender, nondistended, normal bowel sounds.  EXTREMITIES:  No edema, 2+ dorsalis pedis pulses bilaterally, 2+ radial  pulses bilaterally, 2+ right femoral pulse without bruit.  NEUROLOGIC:  Nonfocal.  All 4 extremities have 5/5 strength.   DIAGNOSTIC TESTS:  Electrocardiogram from [facility] at 1:54 a.m.  reveals normal  sinus rhythm with a rate of 61 and no ischemic ST or T-  wave changes.   LABS:  Sodium 140, potassium 4.1, BUN 14, creatinine 0.8, glucose 106.  White blood cells 6.3, hemoglobin 13.6, platelets 218.  Point-of-care CK-  MB 1.6, point-of-care troponin less than 0.05.   IMPRESSION:  A 77 year old white man with a very good story for  progressive angina; however, it is surprising to me that he has negative  electrocardiogram and negative first set of cardiac enzymes.  Nevertheless, he will be admitted to the coronary care unit for high-  risk chest pain.   PLAN:  1. Continue to rule out myocardial infarction by cycling serial      cardiac enzymes and electrocardiograms.  2. Continue aspirin and heparin therapy, initiate empiric statin      therapy, and initiate beta blocker therapy.  3. Will continue nitroglycerin drip and supplement with morphine to      make him pain-free.  4. He probably needs a heart catheterization in the morning and will      be kept NPO after midnight.  We will check coags and hydrate      appropriately.  5. We will check a fasting lipid panel for risk stratification.  6. Finally, we will consider other etiologies of chest pain, including      pulmonary embolism, esophageal spasm, and musculoskeletal pain,      although all etiologies other than angina seem far less likely at      this time.  We will also need to check a chest x-ray to evaluate      for pulmonary disease and for mediastinal contour.      Christell Faith, MD  Electronically Signed     NDL/MEDQ  D:  07/19/2007  T:  07/19/2007  Job:  [id number]

## 2011-01-30 NOTE — Op Note (Signed)
NAME:  [name], [name]                        ACCOUNT NO.:  [id number]   MEDICAL RECORD NO.:  [id number]                   PATIENT TYPE:  AMB   LOCATION:  DAY                                  FACILITY:  [facility]   PHYSICIAN:  Bernerd Limbo. Leona Carry, M.D.             DATE OF BIRTH:  1928/10/08   DATE OF PROCEDURE:  11/14/2003  DATE OF DISCHARGE:                                 OPERATIVE REPORT   PREOPERATIVE DIAGNOSIS:  Chronic cholecystitis.   POSTOPERATIVE DIAGNOSIS:  Chronic cholecystitis.   PROCEDURE:  Laparoscopic cholecystectomy.   SURGEON:  Bernerd Limbo. Leona Carry, M.D.   ASSISTANT:  Dalia Heading, M.D.   DESCRIPTION OF PROCEDURE:  Under adequate general anesthesia, the patient  was prepped and draped in the usual manner.   A small incision was made above the umbilicus.  Through this opening, a  Veress needle was inserted into the peritoneal cavity and the abdomen  insufflated up to 15 mm of carbon dioxide pressure, 4 to 5 L.  The needle  was removed, and the trocar with the videoscope was inserted into the  peritoneal cavity.  There were noted to be multiple adhesions around the  gallbladder.  Under direct vision, another small incision was made about 2  cm below and to the right of the xyphoid.  Through this opening, a 10-mm  trocar was  inserted.  Through this port, the dissecting instrument was  inserted.  Two small incisions were made in the right, the first about 2 cm  below the lowest rib and the second 2 cm and lateral to this previous  incision.  Through these two openings, the 5-mm trocars were inserted.  Through the 5-mm ports, the retracting forceps were inserted.  The  gallbladder was grasped at the tip, and multiple adhesions were removed by  means of blunt dissection.  The gallbladder was grasped at the infundibulum.  Upward and lateral traction were placed on this structure.  By means of  blunt dissection, the cystic duct was identified, triply clipped distally,  singly clipped proximally, and transected.  The cystic artery was doubly  clipped distally and then electrofulgurated.  Then utilizing the  electrocautery, the gallbladder was removed from below upwards with minimal  difficulty.  Bleeding points were electrofulgurated.  The operative site was  irrigated with saline, and all bleeding appeared to be under control.  All  fluid was aspirated.  Surgicel was placed in the gallbladder bed.  After  determining that all bleeding was under control, the instruments were  removed under direct vision after the abdomen had been decompressed.  The  fascial layers were closed with interrupted #1 Vicryl.  The skin and  subcutaneous tissues were closed with skin clips.  Telfa and OpSite  dressings were applied.   The patient tolerated the procedure nicely and left the room in good  condition.  It should be mentioned that the gallbladder was removed through  the upper 10-mm port.      ___________________________________________                                            Bernerd Limbo. Leona Carry, M.D.   NMD/MEDQ  D:  11/14/2003  T:  11/14/2003  Job:  [id number]

## 2011-04-20 ENCOUNTER — Encounter: Payer: Self-pay | Admitting: Adult Health

## 2011-04-20 ENCOUNTER — Ambulatory Visit (INDEPENDENT_AMBULATORY_CARE_PROVIDER_SITE_OTHER): Payer: Medicare Other | Admitting: Adult Health

## 2011-04-20 DIAGNOSIS — E785 Hyperlipidemia, unspecified: Secondary | ICD-10-CM

## 2011-04-20 DIAGNOSIS — I2510 Atherosclerotic heart disease of native coronary artery without angina pectoris: Secondary | ICD-10-CM

## 2011-04-20 NOTE — Assessment & Plan Note (Signed)
He is followed by Dr. [name] for this. Intolerant to statins.

## 2011-04-20 NOTE — Assessment & Plan Note (Signed)
He is stable at this time without complaints of a cardiac nature.  He remains active, without symptoms. He refuses any antihypertensives although BP is consistently over 150 mmHg.  He states BP is lower at home.  He states BP is very low on any medications with the exception of the nitrates he takes daily.  I will make no changes.  AoV murmur is not new.  Will reassess next year unless he becomes symptomatic.  This has been explained to him.

## 2011-04-20 NOTE — Patient Instructions (Signed)
Your physician recommends that you continue on your current medications as directed. Please refer to the Current Medication list given to you today.  Your physician recommends that you schedule a follow-up appointment in: 1 year  

## 2011-04-20 NOTE — Progress Notes (Signed)
HPI: [name] is a 77 y/o patient of Dr Dietrich Pates we are following for ongoing assessment and treatment of CAD. He has a history of NSTEMI in 11/08 with DES to PL, and nonobstructive disease elsewhere.  He is intolerant of statins causing myalgias and most antihypertensives causing hypotension.    He is here for annual follow-up. He has had a head cold and some sinus drainage, but no complaints of chest pain or shortness of breath. He has not had any hospitalization, new allergies, or surgeries since last being seen one year ago. He is followed by Dr. [name], his primary care physician and had had recent labs drawn.    Allergies  Allergen Reactions  . Ciprofloxacin   . Statins     Intolerant causing myalgias    Current Outpatient Prescriptions  Medication Sig Dispense Refill  . Acetaminophen (TYLENOL ARTHRITIS PAIN PO) Take 1 tablet by mouth daily.        Marland Kitchen aspirin 81 MG tablet Take 81 mg by mouth daily.        . diphenhydrAMINE (BENADRYL) 25 MG tablet Take 25 mg by mouth every 6 (six) hours as needed.        . isosorbide dinitrate (ISORDIL) 10 MG tablet TAKE ONE TABLET BY MOUTH THREE TIMES DAILY  90 tablet  3  . Multiple Vitamins-Minerals (MULTIVITAMIN WITH MINERALS) tablet Take 1 tablet by mouth daily.        . Potassium 99 MG TABS Take 1 tablet by mouth daily.        . ranitidine (ZANTAC) 150 MG tablet Take 150 mg by mouth daily.        . Tamsulosin HCl (FLOMAX) 0.4 MG CAPS Take 0.4 mg by mouth daily after supper.          Past Medical History  Diagnosis Date  . Coronary artery disease 2008    MI- Cath: Nonobstructive LAD and Left CX. Severe Right PL stenosis with successful PCI using DES.   Marland Kitchen Hypertension   . Hyperlipidemia   . Aortic valve sclerosis   . Laryngeal cancer   . Pyelonephritis      ROS: Review of systems complete and found to be negative unless listed above PHYSICAL EXAM BP 158/81  Pulse 67  Ht 5\' 10"  (1.778 m)  Wt 207 lb (93.895 kg)  BMI 29.70 kg/m2  SpO2  94%  General: Well developed, well nourished, in no acute distress Head: Eyes PERRLA, No xanthomas.   Normal cephalic and atramatic  Lungs: Clear bilaterally to auscultation and percussion. Heart: HRRR S1 S2, 1/6 systolic murmur heard best at the RSB..  Pulses are 2+ & equal.            No carotid bruit. No JVD.  No abdominal bruits. No femoral bruits. Abdomen: Bowel sounds are positive, abdomen soft and non-tender without masses or                  Hernia's noted. Msk:  Back normal, normal gait. Normal strength and tone for age. Extremities: No clubbing, cyanosis or edema.  DP +1 Neuro: Alert and oriented X 3. Psych:  Good affect, responds appropriately  EKG: NSR rate of 68 bpm.  ASSESSMENT AND PLAN

## 2011-04-21 ENCOUNTER — Encounter: Payer: Self-pay | Admitting: Adult Health

## 2011-06-23 LAB — BASIC METABOLIC PANEL
BUN: 13
CO2: 32
CO2: 28
CO2: 30
Calcium: 8.9
Calcium: 8.8
Chloride: 100
Chloride: 101
Creatinine, Ser: 0.81
Creatinine, Ser: 0.94
GFR calc Af Amer: >60
GFR calc Af Amer: >60
GFR calc non Af Amer: >60
Sodium: 140
Sodium: 138

## 2011-06-23 LAB — CBC
Hemoglobin: 13.6
Hemoglobin: 13.1
Hemoglobin: 13.4
MCHC: 34.7
MCHC: 34.7
MCV: 96.6
MCV: 97
Platelets: 195
RBC: 4.1 — ABNORMAL LOW
RBC: 3.98 — ABNORMAL LOW
RBC: 4 — ABNORMAL LOW
RDW: 12.7
WBC: 6.3
WBC: 7.3
WBC: 7.7
WBC: 9

## 2011-06-23 LAB — CK TOTAL AND CKMB (NOT AT ARMC)
CK, MB: 5.6 — ABNORMAL HIGH
Total CK: 164

## 2011-06-23 LAB — URINALYSIS, ROUTINE W REFLEX MICROSCOPIC
Bilirubin Urine: NEGATIVE
Ketones, ur: NEGATIVE
Nitrite: NEGATIVE
Urobilinogen, UA: 1

## 2011-06-23 LAB — URINE MICROSCOPIC-ADD ON

## 2011-06-23 LAB — COMPREHENSIVE METABOLIC PANEL
ALT: 21
AST: 74 — ABNORMAL HIGH
Alkaline Phosphatase: 57
CO2: 28
Chloride: 100
GFR calc non Af Amer: >60
Glucose, Bld: 139 — ABNORMAL HIGH
Potassium: 4.1
Sodium: 135
Total Bilirubin: 1.6 — ABNORMAL HIGH

## 2011-06-23 LAB — CARDIAC PANEL(CRET KIN+CKTOT+MB+TROPI)
CK, MB: 39 — ABNORMAL HIGH
Relative Index: 10.7 — ABNORMAL HIGH
Relative Index: 13.5 — ABNORMAL HIGH
Relative Index: 15.3 — ABNORMAL HIGH
Relative Index: 8.7 — ABNORMAL HIGH
Total CK: 149
Total CK: 317 — ABNORMAL HIGH
Troponin I: 0.37 — ABNORMAL HIGH
Troponin I: 4.44

## 2011-06-23 LAB — POCT CARDIAC MARKERS
CKMB, poc: 1.6
Troponin i, poc: <0.05

## 2011-06-23 LAB — DIFFERENTIAL
Lymphocytes Relative: 31
Lymphs Abs: 1.9
Monocytes Absolute: 0.6
Monocytes Relative: 10
Neutro Abs: 3.6

## 2011-06-23 LAB — HEPARIN LEVEL (UNFRACTIONATED): Heparin Unfractionated: 0.38

## 2011-06-23 LAB — LIPID PANEL
LDL Cholesterol: 97
Triglycerides: 82

## 2011-06-23 LAB — PROTIME-INR: INR: 1.1

## 2011-06-23 LAB — MAGNESIUM
Magnesium: 1.9
Magnesium: 2

## 2011-06-23 LAB — APTT: aPTT: 77 — ABNORMAL HIGH

## 2011-06-29 ENCOUNTER — Telehealth: Payer: Self-pay | Admitting: Cardiology

## 2011-06-29 ENCOUNTER — Other Ambulatory Visit: Payer: Self-pay | Admitting: *Deleted

## 2011-06-29 MED ORDER — ISOSORBIDE DINITRATE 10 MG PO TABS: 10.0000 mg | ORAL_TABLET | Freq: Three times a day (TID) | ORAL | Status: DC

## 2011-06-29 NOTE — Telephone Encounter (Signed)
PT needs isosorbide 10mg  rx called in to [facility] on [address]/tmj

## 2011-07-08 NOTE — Telephone Encounter (Signed)
This was called in on 06/29/11/tmj

## 2011-10-16 ENCOUNTER — Ambulatory Visit (INDEPENDENT_AMBULATORY_CARE_PROVIDER_SITE_OTHER): Payer: Medicare Other | Admitting: Cardiology

## 2011-10-16 ENCOUNTER — Telehealth: Payer: Self-pay | Admitting: Cardiology

## 2011-10-16 ENCOUNTER — Encounter: Payer: Self-pay | Admitting: Cardiology

## 2011-10-16 DIAGNOSIS — I2510 Atherosclerotic heart disease of native coronary artery without angina pectoris: Secondary | ICD-10-CM

## 2011-10-16 DIAGNOSIS — E785 Hyperlipidemia, unspecified: Secondary | ICD-10-CM

## 2011-10-16 DIAGNOSIS — G459 Transient cerebral ischemic attack, unspecified: Secondary | ICD-10-CM

## 2011-10-16 DIAGNOSIS — I10 Essential (primary) hypertension: Secondary | ICD-10-CM

## 2011-10-16 MED ORDER — ASPIRIN-DIPYRIDAMOLE ER 25-200 MG PO CP12: 1.0000 | ORAL_CAPSULE | Freq: Two times a day (BID) | ORAL | Status: DC

## 2011-10-16 NOTE — Assessment & Plan Note (Signed)
Symptoms are consistent with a TIA, but previous brain and carotid imaging 2 years ago was unrevealing.  Since MRA was performed without contrast, carotids will be reexamined with ultrasound.  Aggrenox will be substituted for aspirin.  Patient will call to report any recurrent symptoms, which will require evaluation by a neurologist and exclusion of a cardiac source of embolism.

## 2011-10-16 NOTE — Telephone Encounter (Signed)
NEW RX CALLED IN TODAY COST $40 A MONTH COULD THEY TRY SOMETHING ELSE?

## 2011-10-16 NOTE — Patient Instructions (Signed)
Your physician recommends that you schedule a follow-up appointment in: 2 months  24 hour Blood pressure monitor - You will be notified about arrangements  Your physician has recommended you make the following change in your medication:  1 - Start Aggrenox 1 tablet twice daily 2 - STOP Aspirin  Call for recurrent symptoms  Your physician has requested that you have a carotid duplex. This test is an ultrasound of the carotid arteries in your neck. It looks at blood flow through these arteries that supply the brain with blood. Allow one hour for this exam. There are no restrictions or special instructions.

## 2011-10-16 NOTE — Assessment & Plan Note (Addendum)
Blood pressure control has been very suboptimal, but patient is fearful of the adverse effects of antihypertensive medications and contends blood pressure is okay at home but not in physicians' offices.  We will attempt to locate a 24-hour blood pressure monitor to seek objective evidence for hypertension that requires pharmacologic therapy.  If he is unwilling to take medication, renal artery endothelial ablation may be a clinical option in the near future.

## 2011-10-16 NOTE — Telephone Encounter (Signed)
This is an excellent price for Aggrenox, the true cost of which would be $300 per month.  Generic dipyridamole would need to be taken 4 times a day, would yield a lower dose of medication and probably could not be much less expensive.  Please check with the patient's pharmacy to determine the cost of dipyridamole 75 mg q.i.d.

## 2011-10-16 NOTE — Progress Notes (Signed)
Patient ID: [name], male   DOB: 05-Apr-1929, 78 y.o.   MRN: [id number] HPI: Patient seen at the request of his wife after he experienced 2 transient disturbances of neurologic function.  He first had a similar spell approximately 2-1/2 years ago when he developed difficulty speaking and numbness in the right hand.  MRI/MRA without contrast was negative for any brain abnormalities or carotid artery disease.  He did well on aspirin until approximately one week ago when he experienced an episode of memory impairment, again associated with numbness in the right hand.  Another similar spell occurred some days later.  In each case, symptoms persisted for 10 minutes or less.  There was no weakness, loss of balance, or clear dysphasia or dysarthria.  He reports no new medication and has been taking his current meds as prescribed.  A brother has a history of grand mal seizures.  Prior to Admission medications   Medication Sig Start Date End Date Taking? Authorizing Provider  Acetaminophen (TYLENOL ARTHRITIS PAIN PO) Take 1 tablet by mouth daily.     Yes Historical Provider, MD  diphenhydrAMINE (BENADRYL) 25 MG tablet Take 25 mg by mouth every 6 (six) hours as needed.     Yes Historical Provider, MD  isosorbide dinitrate (ISORDIL) 10 MG tablet Take 1 tablet (10 mg total) by mouth 3 (three) times daily. 06/29/11  Yes Gerrit Friends. Annali Lybrand, MD  Multiple Vitamins-Minerals (MULTIVITAMIN WITH MINERALS) tablet Take 1 tablet by mouth daily.     Yes Historical Provider, MD  Potassium 99 MG TABS Take 1 tablet by mouth daily.     Yes Historical Provider, MD  ranitidine (ZANTAC) 150 MG tablet Take 150 mg by mouth daily.     Yes Historical Provider, MD  Tamsulosin HCl (FLOMAX) 0.4 MG CAPS Take 0.4 mg by mouth daily after supper.     Yes Historical Provider, MD  dipyridamole-aspirin (AGGRENOX) 25-200 MG per 12 hr capsule Take 1 capsule by mouth 2 (two) times daily. 10/16/11 10/15/12  Gerrit Friends. Dietrich Pates, MD    Allergies    Allergen Reactions  . Ciprofloxacin   . Statins     Intolerant causing myalgias  Past medical history, social history, and family history reviewed and updated.  ROS: Denies chest discomfort, orthopnea, PND, lightheadedness or syncope.  PHYSICAL EXAM: BP 188/95  Pulse 75  Resp 16  Ht 5\' 10"  (1.778 m)  Wt 96.163 kg (212 lb)  BMI 30.42 kg/m2;  Repeat blood pressure 179/90 General-Well developed; no acute distress Body habitus-mildly overweight Neck-No JVD; minimal right carotid bruit Lungs-clear lung fields; resonant to percussion Cardiovascular-normal PMI; normal S1 and S2 Abdomen-normal bowel sounds; soft and non-tender without masses or organomegaly Musculoskeletal-No deformities, no cyanosis or clubbing Neurologic-Normal cranial nerves; symmetric strength and tone; decreased reflexes in the lower extremities but normal in the upper extremities; memory and judgment intact Skin-Warm, no significant lesions Extremities-distal pulses intact; 1/2+ edema  ASSESSMENT AND PLAN:  [place] Bing, MD 10/16/2011 1:07 PM

## 2011-10-16 NOTE — Assessment & Plan Note (Addendum)
No recent assessment of lipids-a profile will be obtained. 

## 2011-10-16 NOTE — Assessment & Plan Note (Signed)
Patient has had no clinical problems with respect coronary disease since a drug-eluting stent placed in 2008 for single vessel disease with normal LV systolic function.  Our focus will continue to be on optimal management of cardiovascular risk factors.

## 2011-10-19 ENCOUNTER — Telehealth: Payer: Self-pay | Admitting: *Deleted

## 2011-10-19 NOTE — Telephone Encounter (Signed)
Message left with patient advising him that $40 was a very good price for Aggrenox and that dipyridamole, which would be the only alternative would be $99 per month.  Advised him to call back and let us know what they wanted to do regarding this medication.

## 2011-10-22 ENCOUNTER — Ambulatory Visit (HOSPITAL_COMMUNITY)
Admission: RE | Admit: 2011-10-22 | Discharge: 2011-10-22 | Disposition: A | Discharge status: Home / Self Care | Payer: Medicare Other | Source: Ambulatory Visit | Attending: Cardiology | Admitting: Cardiology

## 2011-10-22 DIAGNOSIS — R0989 Other specified symptoms and signs involving the circulatory and respiratory systems: Secondary | ICD-10-CM | POA: Insufficient documentation

## 2011-10-22 DIAGNOSIS — I2510 Atherosclerotic heart disease of native coronary artery without angina pectoris: Secondary | ICD-10-CM

## 2011-10-22 DIAGNOSIS — R55 Syncope and collapse: Secondary | ICD-10-CM | POA: Insufficient documentation

## 2011-10-22 DIAGNOSIS — G459 Transient cerebral ischemic attack, unspecified: Secondary | ICD-10-CM

## 2011-10-22 DIAGNOSIS — I10 Essential (primary) hypertension: Secondary | ICD-10-CM

## 2011-10-26 ENCOUNTER — Other Ambulatory Visit: Payer: Self-pay | Admitting: Cardiology

## 2011-10-26 ENCOUNTER — Other Ambulatory Visit: Payer: Self-pay | Admitting: *Deleted

## 2011-10-26 MED ORDER — ISOSORBIDE DINITRATE 10 MG PO TABS: 10.0000 mg | ORAL_TABLET | Freq: Three times a day (TID) | ORAL | Status: DC

## 2011-12-14 ENCOUNTER — Ambulatory Visit (INDEPENDENT_AMBULATORY_CARE_PROVIDER_SITE_OTHER): Payer: Medicare Other | Admitting: Cardiology

## 2011-12-14 ENCOUNTER — Encounter: Payer: Self-pay | Admitting: Cardiology

## 2011-12-14 VITALS — BP 160/88 | HR 79 | Resp 16 | Ht 70.0 in | Wt 213.0 lb

## 2011-12-14 DIAGNOSIS — E785 Hyperlipidemia, unspecified: Secondary | ICD-10-CM

## 2011-12-14 DIAGNOSIS — E782 Mixed hyperlipidemia: Secondary | ICD-10-CM

## 2011-12-14 DIAGNOSIS — I2510 Atherosclerotic heart disease of native coronary artery without angina pectoris: Secondary | ICD-10-CM

## 2011-12-14 DIAGNOSIS — I10 Essential (primary) hypertension: Secondary | ICD-10-CM

## 2011-12-14 DIAGNOSIS — G459 Transient cerebral ischemic attack, unspecified: Secondary | ICD-10-CM

## 2011-12-14 NOTE — Assessment & Plan Note (Signed)
No further symptoms.  Patient will gradually attempt to increase his dose of Aggrenox towards that recommended by the manufacturer.

## 2011-12-14 NOTE — Progress Notes (Signed)
Patient ID: [name], male   DOB: [birth year], 78 y.o.   MRN: [id number] HPI: Scheduled return visit for this very nice gentleman with a history of coronary disease, multiple cardiovascular risk factors and possible TIA.  Since his last visit, he has been asymptomatic.  He describes numbness in his lower extremities, which I presume is related to neuropathy, but no other neurologic symptoms.  He occasionally experiences instability of gait, but no vertigo or near syncope.  He developed headache with initial dosing of Aggrenox.  By decreasing to one capsule per day, that problem has resolved.  Blood pressure has been checked on multiple occasions at home and remains normal.  When he is in a pharmacy or [facility], initial blood pressure readings are high, but with a few minutes of rest, BP normalizes.  He also describes interscapular discomfort when he reaches behind his back.  Prior to Admission medications   Medication Sig Start Date End Date Taking? Authorizing Provider  Acetaminophen (TYLENOL ARTHRITIS PAIN PO) Take 1 tablet by mouth daily.     Yes Historical Provider, MD  diphenhydrAMINE (BENADRYL) 25 MG tablet Take 25 mg by mouth every 6 (six) hours as needed.     Yes Historical Provider, MD  dipyridamole-aspirin (AGGRENOX) 25-200 MG per 12 hr capsule Take 1 capsule by mouth daily. 10/16/11 10/15/12 Yes Kathlen Brunswick, MD  isosorbide dinitrate (ISORDIL) 10 MG tablet Take 1 tablet (10 mg total) by mouth 3 (three) times daily. 10/26/11  Yes Kathlen Brunswick, MD  Multiple Vitamins-Minerals (MULTIVITAMIN WITH MINERALS) tablet Take 1 tablet by mouth daily.     Yes Historical Provider, MD  Potassium 99 MG TABS Take 1 tablet by mouth daily.     Yes Historical Provider, MD  ranitidine (ZANTAC) 150 MG tablet Take 150 mg by mouth daily.     Yes Historical Provider, MD  Tamsulosin HCl (FLOMAX) 0.4 MG CAPS Take 0.4 mg by mouth daily after supper.     Yes Historical Provider, MD    Allergies  Allergen  Reactions  . Ciprofloxacin   . Statins     Intolerant causing myalgias     Past medical history, social history, and family history reviewed and updated.  ROS: Denies orthopnea, PND, chest discomfort, exertional dyspnea or syncope.  All other systems reviewed and are negative.  PHYSICAL EXAM: BP 160/88  Pulse 79  Resp 16  Ht 5\' 10"  (1.778 m)  Wt 96.616 kg (213 lb)  BMI 30.56 kg/m2   General-Well developed; no acute distress Body habitus-Mildly overweight  Neck-No JVD; no carotid bruits Lungs-clear lung fields; resonant to percussion Cardiovascular-normal PMI; normal S1 and S2; minimal systolic murmur Abdomen-normal bowel sounds; soft and non-tender without masses or organomegaly Musculoskeletal-No deformities, no cyanosis or clubbing Neurologic-Normal cranial nerves; symmetric strength and tone Skin-Warm, no significant lesions Extremities-distal pulses intact; no edema  ASSESSMENT AND PLAN:  Gay Bing, MD 12/14/2011 2:18 PM

## 2011-12-14 NOTE — Assessment & Plan Note (Addendum)
Long-standing whitecoat syndrome.  I am concerned that his usual blood pressure during the day might be sufficiently elevated to require treatment.  We will proceed with 24-hour blood pressure monitoring once a device is available.

## 2011-12-14 NOTE — Patient Instructions (Addendum)
Your physician recommends that you schedule a follow-up appointment in: 7 months  Your physician has recommended you make the following change in your medication:  Attempt to increase Aggrenox to 2 times a day.  If you cannot tolerate, you may take once a day and add Aspirin 81 mg to your regimen.  Your physician recommends that you return for lab work in: Lipids this week  Set up for 24 hour blood pressure monitor   Addendum - 4/5 Patient refused BP monitor

## 2011-12-14 NOTE — Assessment & Plan Note (Signed)
No cardiopulmonary symptoms.  No further testing or treatment for coronary disease warranted at present.

## 2011-12-14 NOTE — Assessment & Plan Note (Signed)
Most recent lipid profile was obtained in 2009 and was excellent at that time.Repeat testing will be performed.  The patient is concerned that he is unable to tolerate statins, but I explained that there are numerous alternatives, many of them virtually free of side effects.

## 2011-12-15 ENCOUNTER — Other Ambulatory Visit: Payer: Self-pay | Admitting: Cardiology

## 2011-12-16 LAB — LIPID PANEL
Cholesterol: 180 mg/dL (ref 0–200)
HDL: 38 mg/dL — ABNORMAL LOW (ref >39)
LDL Cholesterol: 105 mg/dL — ABNORMAL HIGH (ref 0–99)
Triglycerides: 183 mg/dL — ABNORMAL HIGH (ref <150)

## 2011-12-18 ENCOUNTER — Encounter: Payer: Self-pay | Admitting: *Deleted

## 2011-12-18 ENCOUNTER — Other Ambulatory Visit: Payer: Self-pay | Admitting: *Deleted

## 2011-12-18 DIAGNOSIS — E782 Mixed hyperlipidemia: Secondary | ICD-10-CM

## 2011-12-18 MED ORDER — GEMFIBROZIL 600 MG PO TABS: 600.0000 mg | ORAL_TABLET | Freq: Two times a day (BID) | ORAL | Status: DC

## 2011-12-18 NOTE — Telephone Encounter (Signed)
Patient reports that he is only able to tolerate Aggrenox 1 time a day, as previously discussed at office visit.

## 2012-06-17 ENCOUNTER — Other Ambulatory Visit: Payer: Self-pay | Admitting: *Deleted

## 2012-06-17 DIAGNOSIS — E782 Mixed hyperlipidemia: Secondary | ICD-10-CM

## 2012-06-23 LAB — LIPID PANEL
Cholesterol: 181 mg/dL (ref 0–200)
HDL: 42 mg/dL
LDL Cholesterol: 118 mg/dL — ABNORMAL HIGH (ref 0–99)
Total CHOL/HDL Ratio: 4.3 ratio
Triglycerides: 104 mg/dL
VLDL: 21 mg/dL (ref 0–40)

## 2012-06-27 ENCOUNTER — Encounter: Payer: Self-pay | Admitting: Cardiology

## 2012-06-28 ENCOUNTER — Other Ambulatory Visit: Payer: Self-pay | Admitting: *Deleted

## 2012-06-28 ENCOUNTER — Encounter: Payer: Self-pay | Admitting: Cardiology

## 2012-07-28 ENCOUNTER — Ambulatory Visit (INDEPENDENT_AMBULATORY_CARE_PROVIDER_SITE_OTHER): Payer: Medicare Other

## 2012-07-28 ENCOUNTER — Encounter: Payer: Self-pay | Admitting: Orthopedic Surgery

## 2012-07-28 ENCOUNTER — Ambulatory Visit (INDEPENDENT_AMBULATORY_CARE_PROVIDER_SITE_OTHER): Payer: Medicare Other | Admitting: Orthopedic Surgery

## 2012-07-28 VITALS — Ht 70.0 in | Wt 197.0 lb

## 2012-07-28 DIAGNOSIS — M25569 Pain in unspecified knee: Secondary | ICD-10-CM

## 2012-07-28 DIAGNOSIS — M1710 Unilateral primary osteoarthritis, unspecified knee: Secondary | ICD-10-CM | POA: Insufficient documentation

## 2012-07-28 DIAGNOSIS — IMO0002 Reserved for concepts with insufficient information to code with codable children: Secondary | ICD-10-CM

## 2012-07-28 MED ORDER — TRAMADOL-ACETAMINOPHEN 37.5-325 MG PO TABS: 1.0000 | ORAL_TABLET | ORAL | Status: DC | PRN

## 2012-07-28 NOTE — Patient Instructions (Addendum)
You have received a steroid shot. 15% of patients experience increased pain at the injection site with in the next 24 hours. This is best treated with ice and tylenol extra strength 2 tabs every 8 hours. If you are still having pain please call the office.    

## 2012-07-28 NOTE — Progress Notes (Signed)
Patient ID: [name], male   DOB: 1928/12/22, 78 y.o.   MRN: [id number] Chief Complaint  Patient presents with  . Knee Pain    Left knee pain, no injury.   The patient presents with a several week history of recurrent pain in his left knee which started in 2011 he received a cortisone shot back at that time presents now for reevaluation with 7/10 intermittent pain associated with squatting and bending and some swelling  He does get some relief with a half of Vicodin tablet   Medical review of systems history of snoring easy bruising seasonal allergies no weight loss poor vision chest pain heartburn frequency joint pain other than stated. No skin changes numbness nervousness or excessive thirst  Past Medical History  Diagnosis Date  . Coronary artery disease 2008    MI- Cath: Nonobstructive LAD and Left CX. Severe Right PL stenosis with successful PCI using DES.   Marland Kitchen Hypertension   . Hyperlipidemia   . Aortic valve sclerosis   . Laryngeal cancer   . Pyelonephritis     Past Surgical History  Procedure Date  . Cholecystectomy   . Heart stents    Ht 5\' 10"  (1.778 m)  Wt 197 lb (89.359 kg)  BMI 28.27 kg/m2  Examination he is a well-developed well-nourished male grooming and hygiene are intact he is oriented x3 his mood and affect are normal he walks without any gait abnormality  His upper extremities are normal on inspection. No contracture subluxation atrophy or tremor seen  As far as the left knee goes he has a negative McMurray sign does have a slight joint effusion with medial joint line tenderness at the posteromedial corner. The knee is stable strength is normal skin is intact  Distal pulses and temperature normal no edema lymph nodes are negative sensation normal no pathologic reflexes balance and coordination are normal  X-rays show degenerative arthritis medial compartment moderate.  Impression osteoarthritis left knee. May have a subtle cartilage medial meniscal  tear but currently not having real mechanical and episodes and McMurray's sign is negative  Recommend injection followup 4 weeks reassess  Knee  Injection Procedure Note  Pre-operative Diagnosis: left knee oa  Post-operative Diagnosis: same  Indications: pain  Anesthesia: ethyl chloride   Procedure Details   Verbal consent was obtained for the procedure. Time out was completed.The joint was prepped with alcohol, followed by  Ethyl chloride spray and A 20 gauge needle was inserted into the knee via lateral approach; 4ml 1% lidocaine and 1 ml of depomedrol  was then injected into the joint . The needle was removed and the area cleansed and dressed.  Complications:  None; patient tolerated the procedure well.

## 2012-09-01 ENCOUNTER — Ambulatory Visit: Payer: Medicare Other | Admitting: Orthopedic Surgery

## 2012-11-17 ENCOUNTER — Other Ambulatory Visit: Payer: Self-pay | Admitting: Cardiology

## 2013-06-19 ENCOUNTER — Other Ambulatory Visit: Payer: Self-pay | Admitting: *Deleted

## 2013-06-19 DIAGNOSIS — M1712 Unilateral primary osteoarthritis, left knee: Secondary | ICD-10-CM

## 2013-06-19 DIAGNOSIS — M1710 Unilateral primary osteoarthritis, unspecified knee: Secondary | ICD-10-CM

## 2013-06-19 MED ORDER — TRAMADOL-ACETAMINOPHEN 37.5-325 MG PO TABS: 1.0000 | ORAL_TABLET | ORAL | Status: AC | PRN

## 2015-06-20 DIAGNOSIS — Z23 Encounter for immunization: Secondary | ICD-10-CM | POA: Diagnosis not present

## 2015-08-14 DIAGNOSIS — N401 Enlarged prostate with lower urinary tract symptoms: Secondary | ICD-10-CM | POA: Diagnosis not present

## 2015-08-14 DIAGNOSIS — C329 Malignant neoplasm of larynx, unspecified: Secondary | ICD-10-CM | POA: Diagnosis not present

## 2015-08-14 DIAGNOSIS — T63481A Toxic effect of venom of other arthropod, accidental (unintentional), initial encounter: Secondary | ICD-10-CM | POA: Diagnosis not present

## 2015-10-09 DIAGNOSIS — Z23 Encounter for immunization: Secondary | ICD-10-CM | POA: Diagnosis not present

## 2015-10-09 DIAGNOSIS — C329 Malignant neoplasm of larynx, unspecified: Secondary | ICD-10-CM | POA: Diagnosis not present

## 2015-10-09 DIAGNOSIS — Z0000 Encounter for general adult medical examination without abnormal findings: Secondary | ICD-10-CM | POA: Diagnosis not present

## 2015-10-09 DIAGNOSIS — N401 Enlarged prostate with lower urinary tract symptoms: Secondary | ICD-10-CM | POA: Diagnosis not present

## 2015-10-09 DIAGNOSIS — Z1211 Encounter for screening for malignant neoplasm of colon: Secondary | ICD-10-CM | POA: Diagnosis not present

## 2015-10-09 DIAGNOSIS — I119 Hypertensive heart disease without heart failure: Secondary | ICD-10-CM | POA: Diagnosis not present

## 2015-10-09 DIAGNOSIS — E785 Hyperlipidemia, unspecified: Secondary | ICD-10-CM | POA: Diagnosis not present

## 2015-10-09 DIAGNOSIS — I25119 Atherosclerotic heart disease of native coronary artery with unspecified angina pectoris: Secondary | ICD-10-CM | POA: Diagnosis not present

## 2015-10-15 DIAGNOSIS — Z1211 Encounter for screening for malignant neoplasm of colon: Secondary | ICD-10-CM | POA: Diagnosis not present

## 2016-05-12 DIAGNOSIS — Z8521 Personal history of malignant neoplasm of larynx: Secondary | ICD-10-CM | POA: Diagnosis not present

## 2016-05-12 DIAGNOSIS — I10 Essential (primary) hypertension: Secondary | ICD-10-CM | POA: Diagnosis not present

## 2016-05-12 DIAGNOSIS — I639 Cerebral infarction, unspecified: Secondary | ICD-10-CM | POA: Diagnosis not present

## 2016-05-12 DIAGNOSIS — I25119 Atherosclerotic heart disease of native coronary artery with unspecified angina pectoris: Secondary | ICD-10-CM | POA: Diagnosis not present

## 2016-05-13 ENCOUNTER — Other Ambulatory Visit (HOSPITAL_COMMUNITY): Payer: Self-pay | Admitting: Pulmonary Disease

## 2016-05-13 DIAGNOSIS — I639 Cerebral infarction, unspecified: Secondary | ICD-10-CM

## 2016-05-15 ENCOUNTER — Ambulatory Visit (HOSPITAL_COMMUNITY)
Admission: RE | Admit: 2016-05-15 | Discharge: 2016-05-15 | Disposition: A | Discharge status: Home / Self Care | Payer: Medicare Other | Source: Ambulatory Visit | Attending: Pulmonary Disease | Admitting: Pulmonary Disease

## 2016-05-15 DIAGNOSIS — I639 Cerebral infarction, unspecified: Secondary | ICD-10-CM | POA: Insufficient documentation

## 2016-05-15 DIAGNOSIS — I6350 Cerebral infarction due to unspecified occlusion or stenosis of unspecified cerebral artery: Secondary | ICD-10-CM | POA: Diagnosis not present

## 2016-05-15 DIAGNOSIS — I340 Nonrheumatic mitral (valve) insufficiency: Secondary | ICD-10-CM | POA: Insufficient documentation

## 2016-05-15 DIAGNOSIS — E785 Hyperlipidemia, unspecified: Secondary | ICD-10-CM | POA: Diagnosis not present

## 2016-05-15 DIAGNOSIS — I358 Other nonrheumatic aortic valve disorders: Secondary | ICD-10-CM | POA: Diagnosis not present

## 2016-05-15 DIAGNOSIS — I119 Hypertensive heart disease without heart failure: Secondary | ICD-10-CM | POA: Diagnosis not present

## 2016-05-15 NOTE — Progress Notes (Signed)
*  PRELIMINARY RESULTS* Echocardiogram 2D Echocardiogram has been performed.  [name] 05/15/2016, 10:24 AM

## 2016-05-22 ENCOUNTER — Ambulatory Visit (HOSPITAL_COMMUNITY)
Admission: RE | Admit: 2016-05-22 | Discharge: 2016-05-22 | Disposition: A | Discharge status: Home / Self Care | Payer: Medicare Other | Source: Ambulatory Visit | Attending: Pulmonary Disease | Admitting: Pulmonary Disease

## 2016-05-22 DIAGNOSIS — I639 Cerebral infarction, unspecified: Secondary | ICD-10-CM | POA: Diagnosis not present

## 2016-05-22 DIAGNOSIS — I6523 Occlusion and stenosis of bilateral carotid arteries: Secondary | ICD-10-CM | POA: Diagnosis not present

## 2016-05-22 DIAGNOSIS — I63233 Cerebral infarction due to unspecified occlusion or stenosis of bilateral carotid arteries: Secondary | ICD-10-CM | POA: Diagnosis not present

## 2016-05-29 DIAGNOSIS — I25119 Atherosclerotic heart disease of native coronary artery with unspecified angina pectoris: Secondary | ICD-10-CM | POA: Diagnosis not present

## 2016-05-29 DIAGNOSIS — G8191 Hemiplegia, unspecified affecting right dominant side: Secondary | ICD-10-CM | POA: Diagnosis not present

## 2016-05-29 DIAGNOSIS — I69361 Other paralytic syndrome following cerebral infarction affecting right dominant side: Secondary | ICD-10-CM | POA: Diagnosis not present

## 2016-05-29 DIAGNOSIS — I10 Essential (primary) hypertension: Secondary | ICD-10-CM | POA: Diagnosis not present

## 2016-07-30 ENCOUNTER — Emergency Department (HOSPITAL_COMMUNITY): Payer: Medicare Other

## 2016-07-30 ENCOUNTER — Encounter (HOSPITAL_COMMUNITY): Payer: Self-pay | Admitting: Emergency Medicine

## 2016-07-30 ENCOUNTER — Inpatient Hospital Stay (HOSPITAL_COMMUNITY)
Admission: EM | Admit: 2016-07-30 | Discharge: 2016-08-14 | DRG: 025 | Disposition: E | Payer: Medicare Other | Attending: Internal Medicine | Admitting: Internal Medicine

## 2016-07-30 DIAGNOSIS — I6200 Nontraumatic subdural hemorrhage, unspecified: Secondary | ICD-10-CM | POA: Diagnosis not present

## 2016-07-30 DIAGNOSIS — S065XAA Traumatic subdural hemorrhage with loss of consciousness status unknown, initial encounter: Secondary | ICD-10-CM | POA: Diagnosis present

## 2016-07-30 DIAGNOSIS — M1710 Unilateral primary osteoarthritis, unspecified knee: Secondary | ICD-10-CM | POA: Diagnosis present

## 2016-07-30 DIAGNOSIS — I69322 Dysarthria following cerebral infarction: Secondary | ICD-10-CM | POA: Diagnosis not present

## 2016-07-30 DIAGNOSIS — Z66 Do not resuscitate: Secondary | ICD-10-CM | POA: Diagnosis present

## 2016-07-30 DIAGNOSIS — Z7902 Long term (current) use of antithrombotics/antiplatelets: Secondary | ICD-10-CM

## 2016-07-30 DIAGNOSIS — Z9181 History of falling: Secondary | ICD-10-CM | POA: Diagnosis not present

## 2016-07-30 DIAGNOSIS — Z888 Allergy status to other drugs, medicaments and biological substances status: Secondary | ICD-10-CM

## 2016-07-30 DIAGNOSIS — Z881 Allergy status to other antibiotic agents status: Secondary | ICD-10-CM

## 2016-07-30 DIAGNOSIS — Z7982 Long term (current) use of aspirin: Secondary | ICD-10-CM | POA: Diagnosis not present

## 2016-07-30 DIAGNOSIS — W19XXXA Unspecified fall, initial encounter: Secondary | ICD-10-CM | POA: Diagnosis present

## 2016-07-30 DIAGNOSIS — I160 Hypertensive urgency: Secondary | ICD-10-CM | POA: Diagnosis present

## 2016-07-30 DIAGNOSIS — Z9049 Acquired absence of other specified parts of digestive tract: Secondary | ICD-10-CM

## 2016-07-30 DIAGNOSIS — J189 Pneumonia, unspecified organism: Secondary | ICD-10-CM

## 2016-07-30 DIAGNOSIS — J9690 Respiratory failure, unspecified, unspecified whether with hypoxia or hypercapnia: Secondary | ICD-10-CM

## 2016-07-30 DIAGNOSIS — I10 Essential (primary) hypertension: Secondary | ICD-10-CM | POA: Diagnosis present

## 2016-07-30 DIAGNOSIS — I2510 Atherosclerotic heart disease of native coronary artery without angina pectoris: Secondary | ICD-10-CM | POA: Diagnosis present

## 2016-07-30 DIAGNOSIS — D649 Anemia, unspecified: Secondary | ICD-10-CM | POA: Diagnosis present

## 2016-07-30 DIAGNOSIS — Z923 Personal history of irradiation: Secondary | ICD-10-CM | POA: Diagnosis not present

## 2016-07-30 DIAGNOSIS — Z8521 Personal history of malignant neoplasm of larynx: Secondary | ICD-10-CM | POA: Diagnosis not present

## 2016-07-30 DIAGNOSIS — Z4659 Encounter for fitting and adjustment of other gastrointestinal appliance and device: Secondary | ICD-10-CM

## 2016-07-30 DIAGNOSIS — S065X9A Traumatic subdural hemorrhage with loss of consciousness of unspecified duration, initial encounter: Secondary | ICD-10-CM

## 2016-07-30 DIAGNOSIS — I69351 Hemiplegia and hemiparesis following cerebral infarction affecting right dominant side: Secondary | ICD-10-CM | POA: Diagnosis not present

## 2016-07-30 DIAGNOSIS — I358 Other nonrheumatic aortic valve disorders: Secondary | ICD-10-CM | POA: Diagnosis present

## 2016-07-30 DIAGNOSIS — Z515 Encounter for palliative care: Secondary | ICD-10-CM | POA: Diagnosis not present

## 2016-07-30 DIAGNOSIS — J9600 Acute respiratory failure, unspecified whether with hypoxia or hypercapnia: Secondary | ICD-10-CM

## 2016-07-30 DIAGNOSIS — E785 Hyperlipidemia, unspecified: Secondary | ICD-10-CM | POA: Diagnosis present

## 2016-07-30 DIAGNOSIS — S065X0A Traumatic subdural hemorrhage without loss of consciousness, initial encounter: Principal | ICD-10-CM | POA: Diagnosis present

## 2016-07-30 HISTORY — DX: Cerebral infarction, unspecified: I63.9

## 2016-07-30 LAB — COMPREHENSIVE METABOLIC PANEL
ALBUMIN: 4.2 g/dL (ref 3.5–5.0)
ALT: 16 U/L — ABNORMAL LOW (ref 17–63)
AST: 30 U/L (ref 15–41)
Alkaline Phosphatase: 55 U/L (ref 38–126)
Anion gap: 8 (ref 5–15)
BILIRUBIN TOTAL: 1.5 mg/dL — AB (ref 0.3–1.2)
BUN: 15 mg/dL (ref 6–20)
CHLORIDE: 103 mmol/L (ref 101–111)
CO2: 27 mmol/L (ref 22–32)
Calcium: 9.5 mg/dL (ref 8.9–10.3)
Creatinine, Ser: 0.85 mg/dL (ref 0.61–1.24)
GFR calc Af Amer: >60 mL/min (ref >60)
GFR calc non Af Amer: >60 mL/min (ref >60)
GLUCOSE: 94 mg/dL (ref 65–99)
POTASSIUM: 4.1 mmol/L (ref 3.5–5.1)
Sodium: 138 mmol/L (ref 135–145)
Total Protein: 7.5 g/dL (ref 6.5–8.1)

## 2016-07-30 LAB — CBC WITH DIFFERENTIAL/PLATELET
Basophils Absolute: 0 10*3/uL (ref 0.0–0.1)
Basophils Relative: 0 %
Eosinophils Absolute: 0 10*3/uL (ref 0.0–0.7)
Eosinophils Relative: 0 %
HCT: 46.1 % (ref 39.0–52.0)
HEMOGLOBIN: 16 g/dL (ref 13.0–17.0)
LYMPHS ABS: 1.5 10*3/uL (ref 0.7–4.0)
LYMPHS PCT: 19 %
MCH: 33 pg (ref 26.0–34.0)
MCHC: 34.7 g/dL (ref 30.0–36.0)
MCV: 95.1 fL (ref 78.0–100.0)
Monocytes Absolute: 0.9 10*3/uL (ref 0.1–1.0)
Monocytes Relative: 11 %
NEUTROS ABS: 5.5 10*3/uL (ref 1.7–7.7)
NEUTROS PCT: 70 %
Platelets: 260 10*3/uL (ref 150–400)
RBC: 4.85 MIL/uL (ref 4.22–5.81)
RDW: 12.7 % (ref 11.5–15.5)
WBC: 7.9 10*3/uL (ref 4.0–10.5)

## 2016-07-30 LAB — URINALYSIS, ROUTINE W REFLEX MICROSCOPIC
Bilirubin Urine: NEGATIVE
GLUCOSE, UA: NEGATIVE mg/dL
Ketones, ur: NEGATIVE mg/dL
Nitrite: NEGATIVE
Protein, ur: 30 mg/dL — AB
SPECIFIC GRAVITY, URINE: 1.01 (ref 1.005–1.030)
pH: 7 (ref 5.0–8.0)

## 2016-07-30 LAB — TROPONIN I: Troponin I: <0.03 ng/mL (ref <0.03)

## 2016-07-30 LAB — URINE MICROSCOPIC-ADD ON: SQUAMOUS EPITHELIAL / LPF: NONE SEEN

## 2016-07-30 LAB — LACTIC ACID, PLASMA
Lactic Acid, Venous: 1.7 mmol/L (ref 0.5–1.9)
Lactic Acid, Venous: 1 mmol/L (ref 0.5–1.9)

## 2016-07-30 MED ORDER — DEXTROSE 5 % IV SOLN
1.0000 g | INTRAVENOUS | Status: DC
  Administered 2016-07-31: 1 g via INTRAVENOUS
  Filled 2016-07-30 (×2): qty 10

## 2016-07-30 MED ORDER — KETOROLAC TROMETHAMINE 15 MG/ML IJ SOLN: 15.0000 mg | Freq: Four times a day (QID) | INTRAMUSCULAR | Status: DC | PRN

## 2016-07-30 MED ORDER — ACETAMINOPHEN 650 MG RE SUPP
650.0000 mg | Freq: Four times a day (QID) | RECTAL | Status: DC | PRN
  Administered 2016-07-31 – 2016-08-02 (×3): 650 mg via RECTAL
  Filled 2016-07-30 (×3): qty 1

## 2016-07-30 MED ORDER — NITROGLYCERIN 0.4 MG SL SUBL: 0.4000 mg | SUBLINGUAL_TABLET | Freq: Every day | SUBLINGUAL | Status: DC | PRN

## 2016-07-30 MED ORDER — ONDANSETRON HCL 4 MG PO TABS: 4.0000 mg | ORAL_TABLET | Freq: Four times a day (QID) | ORAL | Status: DC | PRN

## 2016-07-30 MED ORDER — DEXTROSE 5 % IV SOLN
500.0000 mg | INTRAVENOUS | Status: DC
  Administered 2016-07-31: 500 mg via INTRAVENOUS
  Filled 2016-07-30 (×2): qty 500

## 2016-07-30 MED ORDER — SODIUM CHLORIDE 0.9 % IV SOLN
INTRAVENOUS | Status: DC
  Administered 2016-07-30: 13:00:00 via INTRAVENOUS

## 2016-07-30 MED ORDER — ACETAMINOPHEN 325 MG PO TABS
650.0000 mg | ORAL_TABLET | Freq: Four times a day (QID) | ORAL | Status: DC | PRN
  Administered 2016-08-02 – 2016-08-03 (×3): 650 mg via ORAL
  Filled 2016-07-30 (×3): qty 2

## 2016-07-30 MED ORDER — DEXTROSE 5 % IV SOLN
500.0000 mg | Freq: Once | INTRAVENOUS | Status: AC
  Administered 2016-07-30: 500 mg via INTRAVENOUS
  Filled 2016-07-30: qty 500

## 2016-07-30 MED ORDER — ISOSORBIDE DINITRATE 10 MG PO TABS
10.0000 mg | ORAL_TABLET | Freq: Three times a day (TID) | ORAL | Status: DC
  Administered 2016-07-30 – 2016-07-31 (×2): 10 mg via ORAL
  Filled 2016-07-30 (×2): qty 1

## 2016-07-30 MED ORDER — SENNOSIDES-DOCUSATE SODIUM 8.6-50 MG PO TABS: 1.0000 | ORAL_TABLET | Freq: Every evening | ORAL | Status: DC | PRN

## 2016-07-30 MED ORDER — ACETAMINOPHEN 500 MG PO TABS: 1000.0000 mg | ORAL_TABLET | Freq: Every evening | ORAL | Status: DC | PRN

## 2016-07-30 MED ORDER — DEXTROSE 5 % IV SOLN
1.0000 g | Freq: Once | INTRAVENOUS | Status: AC
  Administered 2016-07-30: 1 g via INTRAVENOUS
  Filled 2016-07-30: qty 10

## 2016-07-30 MED ORDER — TAMSULOSIN HCL 0.4 MG PO CAPS
0.4000 mg | ORAL_CAPSULE | ORAL | Status: DC
  Administered 2016-07-30: 0.4 mg via ORAL
  Filled 2016-07-30: qty 1

## 2016-07-30 MED ORDER — POTASSIUM 99 MG PO TABS: 1.0000 | ORAL_TABLET | Freq: Every day | ORAL | Status: DC

## 2016-07-30 MED ORDER — ADULT MULTIVITAMIN W/MINERALS CH
1.0000 | ORAL_TABLET | Freq: Every day | ORAL | Status: DC
  Administered 2016-07-30 – 2016-07-31 (×3): 1 via ORAL
  Filled 2016-07-30 (×2): qty 1

## 2016-07-30 MED ORDER — SODIUM CHLORIDE 0.9 % IV SOLN: INTRAVENOUS | Status: DC

## 2016-07-30 MED ORDER — ONDANSETRON HCL 4 MG/2ML IJ SOLN: 4.0000 mg | Freq: Four times a day (QID) | INTRAMUSCULAR | Status: DC | PRN

## 2016-07-30 NOTE — ED Notes (Signed)
ED Provider at bedside. 

## 2016-07-30 NOTE — ED Notes (Signed)
To Ct 

## 2016-07-30 NOTE — ED Notes (Signed)
Patient unable to void at this time. Wife at bedside.

## 2016-07-30 NOTE — Progress Notes (Addendum)
RN paged Dr. Marily Memos. Dr. Marily Memos returned call, stated to page neurosurgery.   Dr. Kathyrn Sheriff called, N/S to see patient.

## 2016-07-30 NOTE — ED Provider Notes (Signed)
[facility] DEPT Provider Note   CSN: [id number] Arrival date & time: 08/02/2016  0940     History   Chief Complaint Chief Complaint  Patient presents with  . Hematuria  . Weakness    HPI [name] is a 82 y.o. male.  HPI  Pt was seen at 1010. Per pt and his wife, c/o gradual onset and worsening of persistent generalized weakness for the past 5 days, worse since yesterday. Pt has hx CVA 04/2016, with residual speech and right sided weakness. Pt's wife states he has fallen 2 days ago, then again yesterday, hitting his head during both falls. No reported syncope. No AMS from baseline, no new focal motor weakness, no CP/SOB, no abd pain, no N/V/D, no fevers.   Past Medical History:  Diagnosis Date  . Aortic valve sclerosis   . Coronary artery disease 2008   MI- Cath: Nonobstructive LAD and Left CX. Severe Right PL stenosis with successful PCI using DES.   Marland Kitchen Hyperlipidemia   . Hypertension   . Laryngeal cancer ([place])   . Pyelonephritis   . Stroke [facility])     Patient Active Problem List   Diagnosis Date Noted  . OA (osteoarthritis) of knee 07/28/2012  . Hypertension   . HYPERLIPIDEMIA 04/14/2010  . ATHEROSCLEROTIC CARDIOVASCULAR DISEASE 04/14/2010  . TIA (transient ischemic attack) 01/12/2009    Past Surgical History:  Procedure Laterality Date  . CHOLECYSTECTOMY    . heart stents         Home Medications    Prior to Admission medications   Medication Sig Start Date End Date Taking? Authorizing Provider  Acetaminophen (TYLENOL ARTHRITIS PAIN PO) Take 1 tablet by mouth daily.      Historical Provider, MD  diphenhydrAMINE (BENADRYL) 25 MG tablet Take 25 mg by mouth every 6 (six) hours as needed.      Historical Provider, MD  isosorbide dinitrate (ISORDIL) 10 MG tablet take 1 tablet by mouth three times a day 11/17/12   Yehuda Savannah, MD  Multiple Vitamins-Minerals (MULTIVITAMIN WITH MINERALS) tablet Take 1 tablet by mouth daily.      Historical Provider, MD    Potassium 99 MG TABS Take 1 tablet by mouth daily.      Historical Provider, MD  ranitidine (ZANTAC) 150 MG tablet Take 150 mg by mouth daily.      Historical Provider, MD  Tamsulosin HCl (FLOMAX) 0.4 MG CAPS Take 0.4 mg by mouth daily after supper.      Historical Provider, MD  traMADol-acetaminophen (ULTRACET) 37.5-325 MG per tablet Take 1 tablet by mouth every 4 (four) hours as needed for pain. 06/19/13   Carole Civil, MD    Family History Family History  Problem Relation Age of Onset  . Heart disease      Social History Social History  Substance Use Topics  . Smoking status: Never Smoker  . Smokeless tobacco: Never Used  . Alcohol use No     Allergies   Ciprofloxacin and Statins   Review of Systems Review of Systems ROS: Statement: All systems negative except as marked or noted in the HPI; Constitutional: Negative for fever and chills. ; ; Eyes: Negative for eye pain, redness and discharge. ; ; ENMT: Negative for ear pain, hoarseness, nasal congestion, sinus pressure and sore throat. ; ; Cardiovascular: Negative for chest pain, palpitations, diaphoresis, dyspnea and peripheral edema. ; ; Respiratory: Negative for cough, wheezing and stridor. ; ; Gastrointestinal: Negative for nausea, vomiting, diarrhea, abdominal pain, blood  in stool, hematemesis, jaundice and rectal bleeding. . ; ; Genitourinary: Negative for dysuria, flank pain and +hematuria. ; ; Musculoskeletal: Negative for back pain and neck pain. Negative for swelling and trauma.; ; Skin: Negative for pruritus, rash, abrasions, blisters, bruising and skin lesion.; ; Neuro: +generalized weakness. Negative for headache, lightheadedness and neck stiffness. Negative for altered level of consciousness, altered mental status, new extremity weakness, paresthesias, involuntary movement, seizure and syncope.     Physical Exam Updated Vital Signs BP 166/72   Pulse (!) 55   Temp 98.3 F (36.8 C) (Oral)   Resp 18   Ht 5'  10" (1.778 m)   Wt 197 lb (89.4 kg)   SpO2 99%   BMI 28.27 kg/m   Physical Exam 1015: Physical examination:  Nursing notes reviewed; Vital signs and O2 SAT reviewed;  Constitutional: Well developed, Well nourished, In no acute distress; Head:  Normocephalic, atraumatic; Eyes: EOMI, PERRL, No scleral icterus; ENMT: Mouth and pharynx normal, Mucous membranes dry; Neck: Supple, Full range of motion, No lymphadenopathy; Cardiovascular: Regular rate and rhythm, No gallop; Respiratory: Breath sounds coarse & equal bilaterally, No wheezes. Normal respiratory effort/excursion; Chest: Nontender, Movement normal; Abdomen: Soft, Nontender, Nondistended, Normal bowel sounds; Genitourinary: No CVA tenderness; Extremities: Pulses normal, No tenderness, No edema, No calf edema or asymmetry.; Neuro: Awake, alert, interactive with wife and ED staff.  Speech minimal and per baseline. +right sided weakness per hx. Strength 4/5 LUE, 3/5 LLE..; Skin: Color normal, Warm, Dry.   ED Treatments / Results  Labs (all labs ordered are listed, but only abnormal results are displayed)   EKG  EKG Interpretation  Date/Time:  Thursday July 30 2016 10:01:36 EST Ventricular Rate:  63 PR Interval:    QRS Duration: 102 QT Interval:  459 QTC Calculation: 470 R Axis:   33 Text Interpretation:  Sinus rhythm Atrial premature complex Consider left atrial enlargement Probable LVH with secondary repol abnrm Baseline wander When compared with ECG of 07/20/2007 Confirmed by [facility]  MD, Nunzio Cory [phone]) on 07/27/2016 12:16:29 PM       Radiology   Procedures Procedures (including critical care time)  Medications Ordered in ED Medications  0.9 %  sodium chloride infusion (not administered)     Initial Impression / Assessment and Plan / ED Course  I have reviewed the triage vital signs and the nursing notes.  Pertinent labs & imaging results that were available during my care of the patient were reviewed by me and  considered in my medical decision making (see chart for details).  MDM Reviewed: previous chart, vitals and nursing note Reviewed previous: labs and ECG Interpretation: labs, ECG, x-ray and CT scan Total time providing critical care: 30-74 minutes. This excludes time spent performing separately reportable procedures and services. Consults: neurosurgery and admitting MD   CRITICAL CARE Performed by: Alfonzo Feller Total critical care time: 35 minutes Critical care time was exclusive of separately billable procedures and treating other patients. Critical care was necessary to treat or prevent imminent or life-threatening deterioration. Critical care was time spent personally by me on the following activities: development of treatment plan with patient and/or surrogate as well as nursing, discussions with consultants, evaluation of patient's response to treatment, examination of patient, obtaining history from patient or surrogate, ordering and performing treatments and interventions, ordering and review of laboratory studies, ordering and review of radiographic studies, pulse oximetry and re-evaluation of patient's condition.  Results for orders placed or performed during the hospital encounter of 08/12/2016  Comprehensive  metabolic panel  Result Value Ref Range   Sodium 138 135 - 145 mmol/L   Potassium 4.1 3.5 - 5.1 mmol/L   Chloride 103 101 - 111 mmol/L   CO2 27 22 - 32 mmol/L   Glucose, Bld 94 65 - 99 mg/dL   BUN 15 6 - 20 mg/dL   Creatinine, Ser 0.85 0.61 - 1.24 mg/dL   Calcium 9.5 8.9 - 10.3 mg/dL   Total Protein 7.5 6.5 - 8.1 g/dL   Albumin 4.2 3.5 - 5.0 g/dL   AST 30 15 - 41 U/L   ALT 16 (L) 17 - 63 U/L   Alkaline Phosphatase 55 38 - 126 U/L   Total Bilirubin 1.5 (H) 0.3 - 1.2 mg/dL   GFR calc non Af Amer >60 >60 mL/min   GFR calc Af Amer >60 >60 mL/min   Anion gap 8 5 - 15  Troponin I  Result Value Ref Range   Troponin I <0.03 <0.03 ng/mL  Lactic acid, plasma  Result  Value Ref Range   Lactic Acid, Venous 1.7 0.5 - 1.9 mmol/L  CBC with Differential  Result Value Ref Range   WBC 7.9 4.0 - 10.5 K/uL   RBC 4.85 4.22 - 5.81 MIL/uL   Hemoglobin 16.0 13.0 - 17.0 g/dL   HCT 46.1 39.0 - 52.0 %   MCV 95.1 78.0 - 100.0 fL   MCH 33.0 26.0 - 34.0 pg   MCHC 34.7 30.0 - 36.0 g/dL   RDW 12.7 11.5 - 15.5 %   Platelets 260 150 - 400 K/uL   Neutrophils Relative % 70 %   Neutro Abs 5.5 1.7 - 7.7 K/uL   Lymphocytes Relative 19 %   Lymphs Abs 1.5 0.7 - 4.0 K/uL   Monocytes Relative 11 %   Monocytes Absolute 0.9 0.1 - 1.0 K/uL   Eosinophils Relative 0 %   Eosinophils Absolute 0.0 0.0 - 0.7 K/uL   Basophils Relative 0 %   Basophils Absolute 0.0 0.0 - 0.1 K/uL   Dg Chest 2 View Result Date: 08/09/2016 CLINICAL DATA:  Gross hematuria over the last 2 days. Generalized weakness. EXAM: CHEST  2 VIEW COMPARISON:  07/18/2016 FINDINGS: Heart size is normal. There is aortic atherosclerosis. There is mild patchy infiltrate at both lung bases consist with mild basilar pneumonia. Upper lungs are clear. No dense consolidation or lobar collapse. No effusion. Chronic degenerative changes affect the spine. IMPRESSION: Mild patchy bibasilar pneumonia left more than right. Aortic atherosclerosis. Electronically Signed   By: Nelson Chimes M.D.   On: 07/31/2016 11:14   Ct Head Wo Contrast Result Date: 08/09/2016 CLINICAL DATA:  Generalized weakness.  Poor historian. EXAM: CT HEAD WITHOUT CONTRAST TECHNIQUE: Contiguous axial images were obtained from the base of the skull through the vertex without intravenous contrast. COMPARISON:  MRI from 9/8/ 2017 FINDINGS: Brain: Bilateral acute on chronic subdural hematomas noted. Right subdural is mainly frontal parietal and is larger measuring up to about 2.5 cm thickness in the frontal region. This generates 1.4 cm right to left anterior subfalcine midline shift and generates substantial mass-effect on the lateral ventricle. More acute blood products  are seen layering dependently in the extra-axial fluid collection Left subdural is high parietal in location and measures up to about 1.1 cm and thickness. Left-sided extra-axial fluid collection is mainly low attenuation suggesting chronicity, but is new since 05/22/2016. Lacunar infarcts are now visible in the pons, at the location of the acute ischemia seen on the previous MRI. Diffuse  loss of parenchymal volume is consistent with atrophy. Patchy low attenuation in the deep hemispheric and periventricular white matter is nonspecific, but likely reflects chronic microvascular ischemic demyelination. Vascular: Atherosclerotic calcification is visualized in the carotid arteries. No dense MCA sign. Major dural sinuses are unremarkable. Dense atherosclerotic calcification noted in the vertebrobasilar system. Skull: No evidence for fracture. No worrisome lytic or sclerotic lesion. Sinuses/Orbits: The visualized paranasal sinuses and mastoid air cells are clear. Visualized portions of the globes and intraorbital fat are unremarkable. Other: Insert 9 IMPRESSION: Bilateral subdural hematomas, right greater than left. The large right acute on chronic extra-axial fluid collection generates substantial mass-effect with 1.4 cm right to left subfalcine midline shift. Critical Value/emergent results were called by me at the time of interpretation on 08/06/2016 at 11:19 am to Dr. Francine Graven , who verbally acknowledged these results. Electronically Signed   By: Misty Stanley M.D.   On: 07/16/2016 11:19    1155:  T/C to [facility] Neurosurgery Dr. Kathyrn Sheriff, case discussed, including:  HPI, pertinent PM/SHx, VS/PE, dx testing, ED course and treatment:  Agreeable to consult, choice would be stop antiplatelets/watchful waiting vs removal of SDH which may lead to re-bleed or have no effect on improving pt's baseline; transfer to [facility] under [facility] service. Dx and testing, as well as d/w Neurosurgeon, d/w pt and family.  Questions  answered.  Verb understanding, agreeable to transfer to [facility] for admit. Pt's wife states she would like to further speak with Neurosurgeon to go over all options more thoroughly.   1315:  Will start IV abx for CAP. T/C to [facility] Dr. Jerilee Hoh, case discussed, including:  HPI, pertinent PM/SHx, VS/PE, dx testing, ED course and treatment:  Agreeable to facilitate transfer to [facility], requests to write temporary orders, obtain neuro tele bed to team [facility]/Dr. Marily Memos.    Final Clinical Impressions(s) / ED Diagnoses   Final diagnoses:  None    New Prescriptions New Prescriptions   No medications on file     Francine Graven, DO 07/17/2016 2005

## 2016-07-30 NOTE — ED Triage Notes (Signed)
Pt's wife states pt has had blood in urine for past 2 days.  Was started on Plavix in August due to stroke.  Pt has residual deficit from this.  Pt fell last night, but denies any injury.  Dr. Luan Pulling told to come here for evaluation.

## 2016-07-30 NOTE — H&P (Signed)
History and Physical    [name] [id number] DOB: 09/27/1928 DOA: 08/11/2016  PCP: Alonza Bogus, MD  Patient coming from: Home   Chief Complaint: Weakness and confusion.  HPI: [name] is a 82 y.o. male with a past medical history significant for HTN, HLD, CAD, and laryngeal cancer, presents with complaints of weakness and confusion that has resulted in recurrent falls. His first fall was three weeks ago (hit his head in the back), he then had a severe headache over the weekend. He fell once again yesterday sideways hitting the side of his right head (witnessed by wife), after which he became more confused and unable to ambulate. Head CT revealed bilateral subdural hematomas, right greater than left. Neurology was consulted and recommended transfer to [facility] for neurosurgeon evaluation. He was also found to have bilateral PNA on CXR. Admission has been requested.  ED Course: Serology unremarkable.  Head CT revealed a bilateral subdural hematomas, right greater than left.   Review of Systems: As per HPI otherwise 10 point review of systems negative.   Past Medical History:  Diagnosis Date  . Aortic valve sclerosis   . Coronary artery disease 2008   MI- Cath: Nonobstructive LAD and Left CX. Severe Right PL stenosis with successful PCI using DES.   Marland Kitchen Hyperlipidemia   . Hypertension   . Laryngeal cancer ([place])   . Pyelonephritis   . Stroke [facility])     Past Surgical History:  Procedure Laterality Date  . CHOLECYSTECTOMY    . heart stents     Social History:  reports that he has never smoked. He has never used smokeless tobacco. He reports that he does not drink alcohol or use drugs.  Allergies  Allergen Reactions  . Ciprofloxacin Other (See Comments)    unknown  . Statins     Intolerant causing myalgias    Family History  Problem Relation Age of Onset  . Heart disease      Prior to Admission medications   Medication Sig Start Date End Date Taking?  Authorizing Provider  acetaminophen (TYLENOL) 500 MG tablet Take 1,000 mg by mouth at bedtime as needed for moderate pain.   Yes Historical Provider, MD  aspirin EC 81 MG tablet Take 81 mg by mouth daily.   Yes Historical Provider, MD  clopidogrel (PLAVIX) 75 MG tablet Take 75 mg by mouth daily.   Yes Historical Provider, MD  isosorbide dinitrate (ISORDIL) 10 MG tablet take 1 tablet by mouth three times a day 11/17/12  Yes Yehuda Savannah, MD  loratadine (CLARITIN) 10 MG tablet Take 10 mg by mouth daily as needed for allergies.   Yes Historical Provider, MD  Multiple Vitamins-Minerals (MULTIVITAMIN WITH MINERALS) tablet Take 1 tablet by mouth daily.     Yes Historical Provider, MD  nitroGLYCERIN (NITROSTAT) 0.4 MG SL tablet Place 1 tablet under the tongue daily as needed for chest pain. 05/25/16  Yes Historical Provider, MD  Potassium 99 MG TABS Take 1 tablet by mouth daily.     Yes Historical Provider, MD  Tamsulosin HCl (FLOMAX) 0.4 MG CAPS Take 0.4 mg by mouth daily after supper.     Yes Historical Provider, MD  traMADol-acetaminophen (ULTRACET) 37.5-325 MG per tablet Take 1 tablet by mouth every 4 (four) hours as needed for pain. 06/19/13  Yes Carole Civil, MD    Physical Exam: Vitals:   08/06/2016 1300 08/01/2016 1330 08/10/2016 1430 07/22/2016 1500  BP: 169/84 (!) 170/107 172/96 189/93  Pulse: 75  65 78 75  Resp: 15 15 18 22   Temp:      TempSrc:      SpO2: 91% 96% 100% 97%  Weight:      Height:          Constitutional: NAD, calm, comfortable Vitals:   07/15/2016 1300 07/25/2016 1330 07/21/2016 1430 08/02/2016 1500  BP: 169/84 (!) 170/107 172/96 189/93  Pulse: 75 65 78 75  Resp: 15 15 18 22   Temp:      TempSrc:      SpO2: 91% 96% 100% 97%  Weight:      Height:       Eyes: PERRL, lids and conjunctivae normal ENMT: Mucous membranes are dry. Posterior pharynx clear of any exudate or lesions.Normal dentition.  Neck: normal, supple, no masses, no thyromegaly Respiratory: clear to  auscultation bilaterally, no wheezing, no crackles. Normal respiratory effort. No accessory muscle use.  Cardiovascular: Regular rate and rhythm, no murmurs / rubs / gallops. No extremity edema. 2+ pedal pulses. No carotid bruits.  Abdomen: no tenderness, no masses palpated. No hepatosplenomegaly. Bowel sounds positive.  Musculoskeletal: no clubbing / cyanosis. No joint deformity upper and lower extremities. Good ROM, no contractures. Normal muscle tone.  Skin: no rashes, lesions, ulcers. No induration Neurologic: decreased strength bilaterally, MS intact Psychiatric: Normal judgment and insight. Alert and oriented x 3. Normal mood.   Labs on Admission: I have personally reviewed following labs and imaging studies  CBC:  Recent Labs Lab 07/22/2016 1029  WBC 7.9  NEUTROABS 5.5  HGB 16.0  HCT 46.1  MCV 95.1  PLT 123456   Basic Metabolic Panel:  Recent Labs Lab 08/13/2016 1029  NA 138  K 4.1  CL 103  CO2 27  GLUCOSE 94  BUN 15  CREATININE 0.85  CALCIUM 9.5   GFR: Estimated Creatinine Clearance: 71.5 mL/min (by C-G formula based on SCr of 0.85 mg/dL). Liver Function Tests:  Recent Labs Lab 07/31/2016 1029  AST 30  ALT 16*  ALKPHOS 55  BILITOT 1.5*  PROT 7.5  ALBUMIN 4.2   No results for input(s): LIPASE, AMYLASE in the last 168 hours. No results for input(s): AMMONIA in the last 168 hours. Coagulation Profile: No results for input(s): INR, PROTIME in the last 168 hours. Cardiac Enzymes:  Recent Labs Lab 07/24/2016 1029  TROPONINI <0.03   BNP (last 3 results) No results for input(s): PROBNP in the last 8760 hours. HbA1C: No results for input(s): HGBA1C in the last 72 hours. CBG: No results for input(s): GLUCAP in the last 168 hours. Lipid Profile: No results for input(s): CHOL, HDL, LDLCALC, TRIG, CHOLHDL, LDLDIRECT in the last 72 hours. Thyroid Function Tests: No results for input(s): TSH, T4TOTAL, FREET4, T3FREE, THYROIDAB in the last 72 hours. Anemia  Panel: No results for input(s): VITAMINB12, FOLATE, FERRITIN, TIBC, IRON, RETICCTPCT in the last 72 hours. Urine analysis:    Component Value Date/Time   COLORURINE AMBER (A) 07/19/2016 1010   APPEARANCEUR HAZY (A) 08/06/2016 1010   LABSPEC 1.010 07/29/2016 1010   PHURINE 7.0 07/29/2016 1010   GLUCOSEU NEGATIVE 08/08/2016 1010   HGBUR LARGE (A) 07/27/2016 1010   BILIRUBINUR NEGATIVE 08/01/2016 1010   KETONESUR NEGATIVE 07/29/2016 1010   PROTEINUR 30 (A) 08/10/2016 1010   UROBILINOGEN 1.0 07/20/2007 0603   NITRITE NEGATIVE 08/03/2016 1010   LEUKOCYTESUR TRACE (A) 07/29/2016 1010   Sepsis Labs: !!!!!!!!!!!!!!!!!!!!!!!!!!!!!!!!!!!!!!!!!!!! @LABRCNTIP (procalcitonin:4,lacticidven:4) )No results found for this or any previous visit (from the past 240 hour(s)).   Radiological Exams on  Admission: Dg Chest 2 View  Result Date: 07/26/2016 CLINICAL DATA:  Gross hematuria over the last 2 days. Generalized weakness. EXAM: CHEST  2 VIEW COMPARISON:  07/18/2016 FINDINGS: Heart size is normal. There is aortic atherosclerosis. There is mild patchy infiltrate at both lung bases consist with mild basilar pneumonia. Upper lungs are clear. No dense consolidation or lobar collapse. No effusion. Chronic degenerative changes affect the spine. IMPRESSION: Mild patchy bibasilar pneumonia left more than right. Aortic atherosclerosis. Electronically Signed   By: Nelson Chimes M.D.   On: 07/18/2016 11:14   Ct Head Wo Contrast  Result Date: 07/15/2016 CLINICAL DATA:  Generalized weakness.  Poor historian. EXAM: CT HEAD WITHOUT CONTRAST TECHNIQUE: Contiguous axial images were obtained from the base of the skull through the vertex without intravenous contrast. COMPARISON:  MRI from 9/8/ 2017 FINDINGS: Brain: Bilateral acute on chronic subdural hematomas noted. Right subdural is mainly frontal parietal and is larger measuring up to about 2.5 cm thickness in the frontal region. This generates 1.4 cm right to left  anterior subfalcine midline shift and generates substantial mass-effect on the lateral ventricle. More acute blood products are seen layering dependently in the extra-axial fluid collection Left subdural is high parietal in location and measures up to about 1.1 cm and thickness. Left-sided extra-axial fluid collection is mainly low attenuation suggesting chronicity, but is new since 05/22/2016. Lacunar infarcts are now visible in the pons, at the location of the acute ischemia seen on the previous MRI. Diffuse loss of parenchymal volume is consistent with atrophy. Patchy low attenuation in the deep hemispheric and periventricular white matter is nonspecific, but likely reflects chronic microvascular ischemic demyelination. Vascular: Atherosclerotic calcification is visualized in the carotid arteries. No dense MCA sign. Major dural sinuses are unremarkable. Dense atherosclerotic calcification noted in the vertebrobasilar system. Skull: No evidence for fracture. No worrisome lytic or sclerotic lesion. Sinuses/Orbits: The visualized paranasal sinuses and mastoid air cells are clear. Visualized portions of the globes and intraorbital fat are unremarkable. Other: Insert 9 IMPRESSION: Bilateral subdural hematomas, right greater than left. The large right acute on chronic extra-axial fluid collection generates substantial mass-effect with 1.4 cm right to left subfalcine midline shift. Critical Value/emergent results were called by me at the time of interpretation on 07/27/2016 at 11:19 am to Dr. Francine Graven , who verbally acknowledged these results. Electronically Signed   By: Misty Stanley M.D.   On: 07/27/2016 11:19    EKG: Independently reviewed. EKG shows sinus rhythm.   Assessment/Plan Active Problems:   Bilateral subdural hematomas (HCC)  Bilateral subdural hematomas  - Patient presents with recurrent falls.  - Head CT revealed bilateral subdural hematomas, right greater than left with a substantial  mass effect with a 1.4 cm right to left midline shift. -These findings were discussed between EDP and neurosurgeon, Dr. Kathyrn Sheriff, who recommended transfer to [facility] for further evaluation and potential surgical intervention. -Will hold ASA and plavix.  CAP -Rocephin/zithro. -Blood/sputum cx requested -Strep pneumo/legionella urine Ag requested  HTN  -Pressures are elevated.  - Continue antihypertensives   CAD  - Stable, no CP.  DVT prophylaxis: SCDs  Code Status: FULL  Family Communication: wife and pastor at bedside updated on plan of care and all questions answered to the best of my knowledge Disposition Plan: to be determined Consults called: Neurology  [facility]: [facility].    Domingo Mend, MD [facility] If 7PM-7AM, please contact night-coverage [email or web address] Password [email or web address]  08/10/2016, 4:06 PM  By signing my name below, I, Collene Leyden, attest that this documentation has been prepared under the direction and in the presence of Domingo Mend MD. Electronically signed: Collene Leyden, Scribe. 08/12/2016    I have reviewed the above documentation for accuracy and completeness, and I agree with the above.  Domingo Mend, MD [facility] Pager: [phone]

## 2016-07-30 NOTE — ED Notes (Signed)
Assisted patient with urinal.

## 2016-07-30 NOTE — Progress Notes (Signed)
Patient arrived to [id number]. Patient is alert but drowsy, oriented x4, follows all commands, moving all extremities, denies pain, bruising to left elbow reportedly from fall. Q2 vitals started. Patient and family updated on plan of care.

## 2016-07-30 NOTE — ED Notes (Signed)
Nurse will call back from [facility] for report.

## 2016-07-30 NOTE — ED Notes (Signed)
Dr. Thurnell Garbe made aware of BP.

## 2016-07-31 ENCOUNTER — Inpatient Hospital Stay (HOSPITAL_COMMUNITY)
Admit: 2016-07-31 | Discharge: 2016-07-31 | Disposition: A | Discharge status: Home / Self Care | Payer: Medicare Other | Attending: Internal Medicine | Admitting: Internal Medicine

## 2016-07-31 ENCOUNTER — Inpatient Hospital Stay (HOSPITAL_COMMUNITY): Payer: Medicare Other

## 2016-07-31 LAB — HIV ANTIBODY (ROUTINE TESTING W REFLEX): HIV Screen 4th Generation wRfx: NONREACTIVE

## 2016-07-31 LAB — BASIC METABOLIC PANEL
ANION GAP: 10 (ref 5–15)
BUN: 13 mg/dL (ref 6–20)
CALCIUM: 9.4 mg/dL (ref 8.9–10.3)
CO2: 26 mmol/L (ref 22–32)
Chloride: 101 mmol/L (ref 101–111)
Creatinine, Ser: 0.8 mg/dL (ref 0.61–1.24)
Glucose, Bld: 128 mg/dL — ABNORMAL HIGH (ref 65–99)
POTASSIUM: 3.9 mmol/L (ref 3.5–5.1)
SODIUM: 137 mmol/L (ref 135–145)

## 2016-07-31 LAB — CBC
HEMATOCRIT: 45.3 % (ref 39.0–52.0)
HEMOGLOBIN: 15.6 g/dL (ref 13.0–17.0)
MCH: 32.4 pg (ref 26.0–34.0)
MCHC: 34.4 g/dL (ref 30.0–36.0)
MCV: 94 fL (ref 78.0–100.0)
Platelets: 272 10*3/uL (ref 150–400)
RBC: 4.82 MIL/uL (ref 4.22–5.81)
RDW: 12.7 % (ref 11.5–15.5)
WBC: 8.6 10*3/uL (ref 4.0–10.5)

## 2016-07-31 LAB — PROTIME-INR
INR: 1.01
PROTHROMBIN TIME: 13.3 s (ref 11.4–15.2)

## 2016-07-31 LAB — TYPE AND SCREEN
ABO/RH(D): A POS
ANTIBODY SCREEN: NEGATIVE

## 2016-07-31 LAB — INFLUENZA PANEL BY PCR (TYPE A & B)
INFLAPCR: NEGATIVE
INFLBPCR: NEGATIVE

## 2016-07-31 LAB — SURGICAL PCR SCREEN
MRSA, PCR: NEGATIVE
Staphylococcus aureus: POSITIVE — AB

## 2016-07-31 LAB — ABO/RH: ABO/RH(D): A POS

## 2016-07-31 MED ORDER — SODIUM CHLORIDE 0.9 % IV SOLN: Freq: Once | INTRAVENOUS | Status: DC

## 2016-07-31 MED ORDER — CHLORHEXIDINE GLUCONATE 0.12 % MT SOLN
15.0000 mL | Freq: Two times a day (BID) | OROMUCOSAL | Status: DC
  Administered 2016-07-31 (×2): 15 mL via OROMUCOSAL
  Filled 2016-07-31 (×2): qty 15

## 2016-07-31 MED ORDER — HYDRALAZINE HCL 20 MG/ML IJ SOLN
10.0000 mg | INTRAMUSCULAR | Status: DC | PRN
  Administered 2016-07-31 – 2016-08-01 (×2): 10 mg via INTRAVENOUS
  Filled 2016-07-31 (×2): qty 1

## 2016-07-31 MED ORDER — ORAL CARE MOUTH RINSE
15.0000 mL | Freq: Two times a day (BID) | OROMUCOSAL | Status: DC
  Administered 2016-07-31 – 2016-08-01 (×3): 15 mL via OROMUCOSAL

## 2016-07-31 MED ORDER — NITROGLYCERIN 2 % TD OINT
1.0000 [in_us] | TOPICAL_OINTMENT | Freq: Four times a day (QID) | TRANSDERMAL | Status: DC
  Administered 2016-07-31 – 2016-08-03 (×12): 1 [in_us] via TOPICAL
  Filled 2016-07-31 (×2): qty 30

## 2016-07-31 MED ORDER — SENNOSIDES-DOCUSATE SODIUM 8.6-50 MG PO TABS: 1.0000 | ORAL_TABLET | Freq: Two times a day (BID) | ORAL | Status: DC

## 2016-07-31 MED ORDER — POLYETHYLENE GLYCOL 3350 17 G PO PACK
17.0000 g | PACK | Freq: Every day | ORAL | Status: DC
Start: 2016-07-31 — End: 2016-07-31
  Administered 2016-07-31: 17 g via ORAL
  Filled 2016-07-31: qty 1

## 2016-07-31 MED ORDER — METOPROLOL TARTRATE 5 MG/5ML IV SOLN
2.5000 mg | Freq: Three times a day (TID) | INTRAVENOUS | Status: DC
  Administered 2016-07-31 – 2016-08-03 (×8): 2.5 mg via INTRAVENOUS
  Filled 2016-07-31 (×8): qty 5

## 2016-07-31 MED ORDER — BISACODYL 10 MG RE SUPP: 10.0000 mg | Freq: Every day | RECTAL | Status: DC | PRN

## 2016-07-31 NOTE — Progress Notes (Signed)
Per Dr. Kathyrn Sheriff, patient to receive 2 units of platelets. One unit placed by Dr. Posey Pronto and released by this RN is in error when released. Only 2 units of platelets to be administered per Dr. Kathyrn Sheriff

## 2016-07-31 NOTE — Progress Notes (Signed)
PT Cancellation Note  Patient Details Name: [name] MRN: [id number] DOB: 05/17/1929   Cancelled Treatment:    Reason Eval/Treat Not Completed: Patient not medically ready Dr Posey Pronto requesting therapy to hold at this time.     [name] 07/31/2016, 10:54 AM Alben Deeds, PT DPT  [phone]

## 2016-07-31 NOTE — Evaluation (Signed)
Clinical/Bedside Swallow Evaluation Patient Details  Name: [name] MRN: [id number] Date of Birth: [birth year]  Today's Date: 07/31/2016 Time: SLP Start Time (ACUTE ONLY): 67 SLP Stop Time (ACUTE ONLY): 1410 SLP Time Calculation (min) (ACUTE ONLY): 15 min  Past Medical History:  Past Medical History:  Diagnosis Date  . Aortic valve sclerosis   . Coronary artery disease 2008   MI- Cath: Nonobstructive LAD and Left CX. Severe Right PL stenosis with successful PCI using DES.   Marland Kitchen Hyperlipidemia   . Hypertension   . Laryngeal cancer ([place])   . Pyelonephritis   . Stroke [facility])    Past Surgical History:  Past Surgical History:  Procedure Laterality Date  . CHOLECYSTECTOMY    . heart stents     HPI:  82 y.o.malewith a past medical history significant for HTN, HLD, CAD, and laryngeal cancer, presents with complaints of weakness and confusion that has resulted inrecurrent falls. His first fall was three weeks ago (hit his head in the back), he then had a severeheadache over the weekend. He fell once again yesterday sideways hitting the side of his right head (witnessed by wife), afterwhich he became more confused and unable to ambulate. Head CT revealed bilateral subdural hematomas, right greater than left. Neurology was consulted and recommended transfer to [facility] for neurosurgeon evaluation. He was also found to have bilateral PNA on CXR   Assessment / Plan / Recommendation Clinical Impression  Pt lethargic, aroused briefly to name, but with minimal speech and not following commands.  Consumed limited ice chips, water, which elicited immediate, wet cough.  Pt with no anticipation/recognition of purees - swallow study ended given poor performance, poor responsiveness.  Recommend continuing NPO; allow meds crushed in puree if pt is sufficiently alert.  Await neurosurgery input.  SLP will follow for plan/PO readiness.     Aspiration Risk  Severe aspiration risk    Diet  Recommendation   NPO; meds crushed in puree if pt is alert  Medication Administration: Crushed with puree    Other  Recommendations Oral Care Recommendations: Oral care QID   Follow up Recommendations        Frequency and Duration min 3x week  2 weeks       Prognosis Prognosis for Safe Diet Advancement: Guarded      Swallow Study   General Date of Onset: 07/19/2016 HPI: 82 y.o.malewith a past medical history significant for HTN, HLD, CAD, and laryngeal cancer, presents with complaints of weakness and confusion that has resulted inrecurrent falls. His first fall was three weeks ago (hit his head in the back), he then had a severeheadache over the weekend. He fell once again yesterday sideways hitting the side of his right head (witnessed by wife), afterwhich he became more confused and unable to ambulate. Head CT revealed bilateral subdural hematomas, right greater than left. Neurology was consulted and recommended transfer to [facility] for neurosurgeon evaluation. He was also found to have bilateral PNA on CXR Type of Study: Bedside Swallow Evaluation Previous Swallow Assessment: no Diet Prior to this Study: NPO Temperature Spikes Noted: No Respiratory Status: Room air History of Recent Intubation: No Behavior/Cognition: Lethargic/Drowsy Oral Cavity Assessment: Within Functional Limits Oral Care Completed by SLP: Recent completion by staff Oral Cavity - Dentition: Missing dentition Self-Feeding Abilities: Total assist Patient Positioning: Upright in bed Baseline Vocal Quality: Low vocal intensity Volitional Cough: Cognitively unable to elicit Volitional Swallow: Unable to elicit    Oral/Motor/Sensory Function Overall Oral Motor/Sensory Function:  (symmetry at  rest)   Ice Chips Ice chips: Within functional limits Presentation: Spoon   Thin Liquid Thin Liquid: Impaired Presentation: Cup;Spoon Oral Phase Functional Implications: Oral holding Pharyngeal  Phase Impairments:  Multiple swallows;Cough - Immediate    Nectar Thick Nectar Thick Liquid: Not tested   Honey Thick Honey Thick Liquid: Not tested   Puree Puree: Not tested   Solid   GO   Solid: Not tested       Taniela Feltus L. Tivis Ringer, [place] CCC/SLP Pager [phone]  Juan Quam Laurice 07/31/2016,2:14 PM

## 2016-07-31 NOTE — Progress Notes (Signed)
Discussed case with Dr. Kathyrn Sheriff.  To OR tomorrow for right sided craniotomy for subdural hematoma evacuation.

## 2016-07-31 NOTE — Progress Notes (Signed)
OT Cancellation Note  Patient Details Name: ARTIN BOUCH MRN: [id number] DOB: [birth year]   Cancelled Treatment:    Reason Eval/Treat Not Completed: Patient not medically ready (awaiting neuro orders for mobility) Dr Posey Pronto requesting therapy to hold at this time.   Vonita Moss   OTR/L Pager: [phone] Office: [phone] .  07/31/2016, 10:53 AM

## 2016-07-31 NOTE — Progress Notes (Signed)
[facility] Progress Note  Patient: [name] [id number]   PCP: Alonza Bogus, MD DOB: Mar 05, 1929   DOA: 08/03/2016   DOS: 07/31/2016   Date of Service: the patient was seen and examined on 07/31/2016  Brief hospital course: Pt. with PMH of HTN, HLD, COPD, laryngeal cancer with persistent dysphagia; admitted on 08/08/2016, with complaint of recurrent fall, was found to have bilateral subdural hematoma with midline shift to 14 mm. Currently further plan is to transfuse platelet and taking the patient to or for evacuation.  Assessment and Plan: 1. Bilateral subdural hematoma. On aspirin and Plavix. Patient has been progressing further with worsening lethargy. Family is considering surgical evacuation as his choice. Patient will receive 2 units of platelets and craniotomy will be attempted for SDH. Family understand patient's risk of the procedure and wished to proceed with that. Currently per Dr. Cyndy Freeze plan is to Go to or Tomorrow.  2. Hypertensive urgency. Completed blood pressure with nitroglycerin patch as well as IV hydralazine.  Goal is around 123456 systolic and [id number] diastolic. Long-term goal is less than 140. May be Needed to be transferred to step down unit if the blood pressure is not well controlled. Also add IV Lopressor and scheduled basis.  3. Suspected community-acquired pneumonia. Cultures are obtained. Currently patient remains nothing by mouth. On IV ceftriaxone and azithromycin. Continue to monitor.  4. Goals of care discussion. The patient's wife is patient's primary POA. Patient already has DO NOT RESUSCITATE form signed. Family understand patient's current prognosis and his understandably willing to take the risk of going through the surgery. If the patient deteriorates rapidly or has poor outcomes of surgery the goal is to proceed with comfort care.  Pain management: When necessary Tylenol at present Activity: Currently holding physical  therapy Bowel regimen: last BM prior to admission Diet: Nothing by mouth DVT Prophylaxis: mechanical compression device.  Advance goals of care discussion: DNR/DNI  Family Communication: family was present at bedside, at the time of interview. Opportunity was given to ask question and all questions were answered satisfactorily.   Disposition:  Discharge to home vs hospice Expected discharge date: 08/03/2016  Consultants: Neurosurgery Procedures: EEG  Antibiotics: Anti-infectives    Start     Dose/Rate Route Frequency Ordered Stop   07/31/16 1300  cefTRIAXone (ROCEPHIN) 1 g in dextrose 5 % 50 mL IVPB     1 g 100 mL/hr over 30 Minutes Intravenous Every 24 hours 07/20/2016 1849 08/06/16 1259   07/31/16 1300  azithromycin (ZITHROMAX) 500 mg in dextrose 5 % 250 mL IVPB     500 mg 250 mL/hr over 60 Minutes Intravenous Every 24 hours 08/02/2016 1849 08/06/16 1259   07/23/2016 1245  cefTRIAXone (ROCEPHIN) 1 g in dextrose 5 % 50 mL IVPB     1 g 100 mL/hr over 30 Minutes Intravenous  Once 07/21/2016 1238 07/27/2016 1347   08/05/2016 1245  azithromycin (ZITHROMAX) 500 mg in dextrose 5 % 250 mL IVPB     500 mg 250 mL/hr over 60 Minutes Intravenous  Once 08/03/2016 1238 07/31/2016 1647        Subjective: Patient is more and more lethargic. Difficulty with with swallowing.  Objective: Physical Exam: Vitals:   07/31/16 1130 07/31/16 1547 07/31/16 1700 07/31/16 1748  BP: (!) 176/100 (!) 190/105 (!) 168/90 (!) 165/88  Pulse: 93  (!) 113 (!) 109  Resp:   16 16  Temp:   98.7 F (37.1 C) 98.6 F (37 C)  TempSrc:   Oral  Oral  SpO2:      Weight:      Height:        Intake/Output Summary (Last 24 hours) at 07/31/16 1821 Last data filed at 07/31/16 1700  Gross per 24 hour  Intake               10 ml  Output                0 ml  Net               10 ml   Filed Weights   07/22/2016 0951  Weight: 89.4 kg (197 lb)    General: Drowsy and lethargic, not following command, Eyes: PERRL, Conjunctiva  normal ENT: Oral Mucosa clear moist. Neck: difficult to assess JVD, no Abnormal Mass Or lumps Cardiovascular: S1 and S2 Present, no Murmur, Respiratory: Bilateral Air entry equal and Decreased, no use of accessory muscle, Clear to Auscultation, no Crackles, no wheezes Abdomen: Bowel Sound present, Soft and no tenderness Skin: no redness, no Rash, no induration Extremities: no Pedal edema, no calf tenderness Neurologic: Limited examination due to lack of participation. Reflexes are difficult to elicit. Spontaneous movement of all extremities.  Data Reviewed: CBC:  Recent Labs Lab 08/11/2016 1029 07/31/16 0435  WBC 7.9 8.6  NEUTROABS 5.5  --   HGB 16.0 15.6  HCT 46.1 45.3  MCV 95.1 94.0  PLT 260 [id number]   Basic Metabolic Panel:  Recent Labs Lab 07/18/2016 1029 07/31/16 0435  NA 138 137  K 4.1 3.9  CL 103 101  CO2 27 26  GLUCOSE 94 128*  BUN 15 13  CREATININE 0.85 0.80  CALCIUM 9.5 9.4    Liver Function Tests:  Recent Labs Lab 08/06/2016 1029  AST 30  ALT 16*  ALKPHOS 55  BILITOT 1.5*  PROT 7.5  ALBUMIN 4.2   No results for input(s): LIPASE, AMYLASE in the last 168 hours. No results for input(s): AMMONIA in the last 168 hours. Coagulation Profile:  Recent Labs Lab 07/31/16 0904  INR 1.01   Cardiac Enzymes:  Recent Labs Lab 08/08/2016 1029  TROPONINI <0.03   BNP (last 3 results) No results for input(s): PROBNP in the last 8760 hours.  CBG: No results for input(s): GLUCAP in the last 168 hours.  Studies: Ct Head Wo Contrast  Result Date: 07/31/2016 CLINICAL DATA:  Followup subdural hematoma EXAM: CT HEAD WITHOUT CONTRAST TECHNIQUE: Contiguous axial images were obtained from the base of the skull through the vertex without intravenous contrast. COMPARISON:  08/01/2016 FINDINGS: Brain: Large right subdural hematoma is predominately low-density with prominent septations. No new area of hemorrhage. The fluid collection appears slightly larger now measuring  27 mm in maximal thickness on coronal imaging compared to 23 mm previously. 8 mm low-density subdural fluid collection left parietal region is unchanged in size 14 mm midline shift to the left. Mild trapping of the left lateral ventricle which remains mildly dilated and unchanged. There is also some dilatation of the right temporal horn due to trapping. This is unchanged. No new area of hemorrhage or infarction. Chronic infarct in the left pons. Vascular: No hyperdense vessel or unexpected calcification. Skull: Negative Sinuses/Orbits: Negative Other: None IMPRESSION: Bilateral low-density subdural hematomas. The right-sided fluid collection appears slightly larger. There are multiple septations in the fluid collection on the right. No new area of high-density hemorrhage. 14 mm midline shift to the left. There is enlargement of the right temporal horn in the left lateral ventricle due  to trapping from mass effect similar to the prior study Chronic infarct left pons. These results will be called to the ordering clinician or representative by the Radiologist Assistant, and communication documented in the PACS or zVision Dashboard. Electronically Signed   By: Franchot Gallo M.D.   On: 07/31/2016 12:53     Scheduled Meds: . sodium chloride   Intravenous Once  . sodium chloride   Intravenous Once  . azithromycin  500 mg Intravenous Q24H  . cefTRIAXone (ROCEPHIN)  IV  1 g Intravenous Q24H  . chlorhexidine  15 mL Mouth Rinse BID  . mouth rinse  15 mL Mouth Rinse q12n4p  . nitroGLYCERIN  1 inch Topical Q6H   Continuous Infusions: . sodium chloride     PRN Meds: acetaminophen **OR** acetaminophen, bisacodyl, hydrALAZINE, ondansetron **OR** ondansetron (ZOFRAN) IV  Time spent: 30 minutes  Author: Berle Mull, MD [facility] Pager: [phone] 07/31/2016 6:21 PM  If 7PM-7AM, please contact night-coverage at [email or web address], password [facility]

## 2016-07-31 NOTE — Progress Notes (Signed)
Patient received 1 unit of platelets, currently infusing second unit. No more infusions. Per Dr. Kathyrn Sheriff, only 2 infusions needed.  Third infusion on chart is in error.

## 2016-07-31 NOTE — Consult Note (Signed)
CC:  Chief Complaint  Patient presents with  . Hematuria  . Weakness    HPI: [name] is a 82 y.o. male transferred to [place] from [facility] yesterday with increased falls and decreased level of consciousness. He had a pontine stroke with residual weakness back in August of this year and was started on ASA/Plavix. He was at his baseline until about Sunday when his wife notes he was tired, unable to go to church. On Tuesday he fell and was taken to his PCP on Wed. Some blood work was ordered, but he fell again on Thursday and was instructed to go to the ED where CT demonstrated a large chronic right SDH. Upon questioning, his wife states that he did fall backward about 3 weeks ago and hit his shoulder, but she says he did not hit his head.  Since yesterday, he has apparently declined. Yesterday he was able to interact, and was able to independently eat his dinner. He has become much more sleepy today, was unable to eat, is not interactive at all.  PMH: Past Medical History:  Diagnosis Date  . Aortic valve sclerosis   . Coronary artery disease 2008   MI- Cath: Nonobstructive LAD and Left CX. Severe Right PL stenosis with successful PCI using DES.   Marland Kitchen Hyperlipidemia   . Hypertension   . Laryngeal cancer ([place])   . Pyelonephritis   . Stroke [facility])     PSH: Past Surgical History:  Procedure Laterality Date  . CHOLECYSTECTOMY    . heart stents      SH: Social History  Substance Use Topics  . Smoking status: Never Smoker  . Smokeless tobacco: Never Used  . Alcohol use No    MEDS: Prior to Admission medications   Medication Sig Start Date End Date Taking? Authorizing Provider  acetaminophen (TYLENOL) 500 MG tablet Take 1,000 mg by mouth at bedtime as needed for moderate pain.   Yes Historical Provider, MD  aspirin EC 81 MG tablet Take 81 mg by mouth daily.   Yes Historical Provider, MD  clopidogrel (PLAVIX) 75 MG tablet Take 75 mg by mouth daily.   Yes Historical Provider, MD   isosorbide dinitrate (ISORDIL) 10 MG tablet take 1 tablet by mouth three times a day 11/17/12  Yes Yehuda Savannah, MD  loratadine (CLARITIN) 10 MG tablet Take 10 mg by mouth daily as needed for allergies.   Yes Historical Provider, MD  Multiple Vitamins-Minerals (MULTIVITAMIN WITH MINERALS) tablet Take 1 tablet by mouth daily.     Yes Historical Provider, MD  nitroGLYCERIN (NITROSTAT) 0.4 MG SL tablet Place 1 tablet under the tongue daily as needed for chest pain. 05/25/16  Yes Historical Provider, MD  Potassium 99 MG TABS Take 1 tablet by mouth daily.     Yes Historical Provider, MD  Tamsulosin HCl (FLOMAX) 0.4 MG CAPS Take 0.4 mg by mouth daily after supper.     Yes Historical Provider, MD  traMADol-acetaminophen (ULTRACET) 37.5-325 MG per tablet Take 1 tablet by mouth every 4 (four) hours as needed for pain. 06/19/13  Yes Carole Civil, MD    ALLERGY: Allergies  Allergen Reactions  . Ciprofloxacin Other (See Comments)    unknown  . Statins     Intolerant causing myalgias    ROS: ROS  NEUROLOGIC EXAM: Drowsy, but arousable with stimulation. Does not say his name Face symmetric Tongue midline Will follow simple commands on both sides  IMGAING: CTH demonstrates large, ~3cm septated right holohemispheric  SDH with significant compression of the right hemisphere and  1.4cm MLS.  IMPRESSION: - 82 y.o. male with large chronic right SDH, on ASA and Plavix until yesterday. With his decline since yesterday, I think the two options are to take a palliative approach/comfort care only, or transfuse platelets and attempt craniotomy for SDH.  PLAN: - At this point, the pts wife and son are leaning toward transfusion and attempting right craniotomy for SDH. - Will plan on ordered platelets, to OR tomorrow am for evacuation  I have reviewed the situation with the patient's family. The options above were discussed. We specifically discussed the risks of the operation including  re-accummulation of acute SDH, inability to wean the ventilator / prolonged intubation, numbness/weakness/paralysis, infection, SZ, HCP. We decided that if there was re-accummulation of the SDH that we would not do a second emergent craniotomy. We also discussed that ability at any point to shift goals of care to a comfort/palliative approach. All their questions were answered.  I mention to them that my partner, Dr. Cyndy Freeze, would likely take over the operative management of [name] in my absence this weekend.

## 2016-07-31 NOTE — Progress Notes (Signed)
Routine EEG completed, results pending. 

## 2016-07-31 NOTE — Progress Notes (Addendum)
RN and nurse student administered medications crushed in applesauce. Patient had delayed swallowing, required verbal prompts for patient to swallow. Patient had sip of thin liquids via spoon after medications. Patient held water in mouth then swallowed. Patient then started coughing. MD notified.  Patient's mouth is dry, patient breathing through mouth. Oral care protocol placed. 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

## 2016-08-01 ENCOUNTER — Inpatient Hospital Stay (HOSPITAL_COMMUNITY): Payer: Medicare Other | Admitting: Certified Registered Nurse Anesthetist

## 2016-08-01 ENCOUNTER — Inpatient Hospital Stay (HOSPITAL_COMMUNITY): Payer: Medicare Other

## 2016-08-01 ENCOUNTER — Encounter (HOSPITAL_COMMUNITY): Payer: Self-pay | Admitting: *Deleted

## 2016-08-01 ENCOUNTER — Encounter (HOSPITAL_COMMUNITY): Admission: EM | Disposition: E | Payer: Self-pay | Source: Home / Self Care | Attending: Internal Medicine

## 2016-08-01 DIAGNOSIS — S065XAA Traumatic subdural hemorrhage with loss of consciousness status unknown, initial encounter: Secondary | ICD-10-CM

## 2016-08-01 DIAGNOSIS — I6200 Nontraumatic subdural hemorrhage, unspecified: Secondary | ICD-10-CM

## 2016-08-01 DIAGNOSIS — J189 Pneumonia, unspecified organism: Secondary | ICD-10-CM

## 2016-08-01 DIAGNOSIS — S065X9A Traumatic subdural hemorrhage with loss of consciousness of unspecified duration, initial encounter: Secondary | ICD-10-CM

## 2016-08-01 HISTORY — PX: CRANIOTOMY: SHX93

## 2016-08-01 LAB — CBC
HEMATOCRIT: 44.9 % (ref 39.0–52.0)
Hemoglobin: 15.4 g/dL (ref 13.0–17.0)
MCH: 32 pg (ref 26.0–34.0)
MCHC: 34.3 g/dL (ref 30.0–36.0)
MCV: 93.2 fL (ref 78.0–100.0)
PLATELETS: 324 10*3/uL (ref 150–400)
RBC: 4.82 MIL/uL (ref 4.22–5.81)
RDW: 12.9 % (ref 11.5–15.5)
WBC: 12 10*3/uL — AB (ref 4.0–10.5)

## 2016-08-01 LAB — PREPARE PLATELET PHERESIS
Unit division: 0
Unit division: 0

## 2016-08-01 LAB — BASIC METABOLIC PANEL
Anion gap: 11 (ref 5–15)
BUN: 18 mg/dL (ref 6–20)
CHLORIDE: 103 mmol/L (ref 101–111)
CO2: 24 mmol/L (ref 22–32)
CREATININE: 0.92 mg/dL (ref 0.61–1.24)
Calcium: 9.2 mg/dL (ref 8.9–10.3)
Glucose, Bld: 122 mg/dL — ABNORMAL HIGH (ref 65–99)
POTASSIUM: 3.6 mmol/L (ref 3.5–5.1)
SODIUM: 138 mmol/L (ref 135–145)

## 2016-08-01 LAB — POCT I-STAT 3, ART BLOOD GAS (G3+)
Bicarbonate: 24.8 mmol/L (ref 20.0–28.0)
O2 SAT: 100 %
PCO2 ART: 40 mmHg (ref 32.0–48.0)
PO2 ART: 227 mmHg — AB (ref 83.0–108.0)
Patient temperature: 98
TCO2: 26 mmol/L (ref 0–100)
pH, Arterial: 7.399 (ref 7.350–7.450)

## 2016-08-01 LAB — TRIGLYCERIDES: TRIGLYCERIDES: 62 mg/dL (ref <150)

## 2016-08-01 LAB — PROCALCITONIN: Procalcitonin: <0.1 ng/mL

## 2016-08-01 LAB — URINE CULTURE

## 2016-08-01 LAB — GLUCOSE, CAPILLARY
Glucose-Capillary: 115 mg/dL — ABNORMAL HIGH (ref 65–99)
Glucose-Capillary: 92 mg/dL (ref 65–99)
Glucose-Capillary: 90 mg/dL (ref 65–99)

## 2016-08-01 SURGERY — CRANIOTOMY HEMATOMA EVACUATION SUBDURAL
Anesthesia: General | Site: Head | Laterality: Right

## 2016-08-01 MED ORDER — ORAL CARE MOUTH RINSE
15.0000 mL | OROMUCOSAL | Status: DC
  Administered 2016-08-01 – 2016-08-03 (×19): 15 mL via OROMUCOSAL

## 2016-08-01 MED ORDER — PANTOPRAZOLE SODIUM 40 MG IV SOLR
40.0000 mg | Freq: Every day | INTRAVENOUS | Status: DC
  Administered 2016-08-01 – 2016-08-02 (×2): 40 mg via INTRAVENOUS
  Filled 2016-08-01 (×2): qty 40

## 2016-08-01 MED ORDER — BACITRACIN 50000 UNITS IM SOLR
INTRAMUSCULAR | Status: DC | PRN
  Administered 2016-08-01: 11:00:00

## 2016-08-01 MED ORDER — CEFAZOLIN SODIUM-DEXTROSE 2-4 GM/100ML-% IV SOLN
2.0000 g | INTRAVENOUS | Status: AC
  Administered 2016-08-01: 2 g via INTRAVENOUS

## 2016-08-01 MED ORDER — ROCURONIUM BROMIDE 10 MG/ML (PF) SYRINGE
PREFILLED_SYRINGE | INTRAVENOUS | Status: AC
  Filled 2016-08-01: qty 10

## 2016-08-01 MED ORDER — FENTANYL CITRATE (PF) 100 MCG/2ML IJ SOLN
INTRAMUSCULAR | Status: AC
  Filled 2016-08-01: qty 4

## 2016-08-01 MED ORDER — SENNA 8.6 MG PO TABS
1.0000 | ORAL_TABLET | Freq: Two times a day (BID) | ORAL | Status: DC
  Administered 2016-08-02 – 2016-08-03 (×3): 8.6 mg via ORAL
  Filled 2016-08-01 (×3): qty 1

## 2016-08-01 MED ORDER — 0.9 % SODIUM CHLORIDE (POUR BTL) OPTIME
TOPICAL | Status: DC | PRN
  Administered 2016-08-01 (×2): 1000 mL

## 2016-08-01 MED ORDER — PROMETHAZINE HCL 25 MG PO TABS
12.5000 mg | ORAL_TABLET | ORAL | Status: DC | PRN
Start: 2016-08-01 — End: 2016-08-03

## 2016-08-01 MED ORDER — HYDROCODONE-ACETAMINOPHEN 5-325 MG PO TABS: 1.0000 | ORAL_TABLET | ORAL | Status: DC | PRN

## 2016-08-01 MED ORDER — ONDANSETRON HCL 4 MG/2ML IJ SOLN: 4.0000 mg | Freq: Once | INTRAMUSCULAR | Status: DC | PRN

## 2016-08-01 MED ORDER — FENTANYL CITRATE (PF) 100 MCG/2ML IJ SOLN
INTRAMUSCULAR | Status: DC | PRN
  Administered 2016-08-01 (×3): 50 ug via INTRAVENOUS

## 2016-08-01 MED ORDER — PROPOFOL 10 MG/ML IV BOLUS
INTRAVENOUS | Status: AC
  Filled 2016-08-01: qty 20

## 2016-08-01 MED ORDER — PROPOFOL 10 MG/ML IV BOLUS
INTRAVENOUS | Status: DC | PRN
Start: 2016-08-01 — End: 2016-08-01
  Administered 2016-08-01 (×2): 100 mg via INTRAVENOUS

## 2016-08-01 MED ORDER — HYDROMORPHONE HCL 1 MG/ML IJ SOLN: 0.5000 mg | INTRAMUSCULAR | Status: DC | PRN

## 2016-08-01 MED ORDER — HEMOSTATIC AGENTS (NO CHARGE) OPTIME
TOPICAL | Status: DC | PRN
  Administered 2016-08-01: 1 via TOPICAL

## 2016-08-01 MED ORDER — SODIUM CHLORIDE 0.9 % IV SOLN
INTRAVENOUS | Status: DC | PRN
  Administered 2016-08-01 (×2): via INTRAVENOUS

## 2016-08-01 MED ORDER — ORAL CARE MOUTH RINSE: 15.0000 mL | Freq: Four times a day (QID) | OROMUCOSAL | Status: DC

## 2016-08-01 MED ORDER — ALBUMIN HUMAN 5 % IV SOLN
INTRAVENOUS | Status: DC | PRN
  Administered 2016-08-01: 10:00:00 via INTRAVENOUS

## 2016-08-01 MED ORDER — PROPOFOL 1000 MG/100ML IV EMUL
0.0000 ug/kg/min | INTRAVENOUS | Status: DC
  Administered 2016-08-01: 10 ug/kg/min via INTRAVENOUS
  Administered 2016-08-01: 30 ug/kg/min via INTRAVENOUS
  Administered 2016-08-02: 20 ug/kg/min via INTRAVENOUS
  Administered 2016-08-02: 10 ug/kg/min via INTRAVENOUS
  Administered 2016-08-02: 30 ug/kg/min via INTRAVENOUS
  Administered 2016-08-03: 20 ug/kg/min via INTRAVENOUS
  Administered 2016-08-03: 25 ug/kg/min via INTRAVENOUS
  Filled 2016-08-01 (×7): qty 100

## 2016-08-01 MED ORDER — CHLORHEXIDINE GLUCONATE 0.12% ORAL RINSE (MEDLINE KIT)
15.0000 mL | Freq: Two times a day (BID) | OROMUCOSAL | Status: DC
  Administered 2016-08-01 – 2016-08-03 (×4): 15 mL via OROMUCOSAL

## 2016-08-01 MED ORDER — LIDOCAINE 2% (20 MG/ML) 5 ML SYRINGE
INTRAMUSCULAR | Status: AC
Start: 2016-08-01 — End: 2016-08-01
  Filled 2016-08-01: qty 5

## 2016-08-01 MED ORDER — ONDANSETRON HCL 4 MG/2ML IJ SOLN
INTRAMUSCULAR | Status: AC
  Filled 2016-08-01: qty 2

## 2016-08-01 MED ORDER — LIDOCAINE-EPINEPHRINE 2 %-1:100000 IJ SOLN
INTRAMUSCULAR | Status: DC | PRN
  Administered 2016-08-01: 15 mL

## 2016-08-01 MED ORDER — ONDANSETRON HCL 4 MG/2ML IJ SOLN: 4.0000 mg | INTRAMUSCULAR | Status: DC | PRN

## 2016-08-01 MED ORDER — SODIUM CHLORIDE 0.9 % IV SOLN
500.0000 mg | Freq: Two times a day (BID) | INTRAVENOUS | Status: DC
  Administered 2016-08-01 – 2016-08-03 (×4): 500 mg via INTRAVENOUS
  Filled 2016-08-01 (×5): qty 5

## 2016-08-01 MED ORDER — BISACODYL 5 MG PO TBEC: 5.0000 mg | DELAYED_RELEASE_TABLET | Freq: Every day | ORAL | Status: DC | PRN

## 2016-08-01 MED ORDER — DOCUSATE SODIUM 100 MG PO CAPS: 100.0000 mg | ORAL_CAPSULE | Freq: Two times a day (BID) | ORAL | Status: DC

## 2016-08-01 MED ORDER — PHENYLEPHRINE HCL 10 MG/ML IJ SOLN
INTRAVENOUS | Status: DC | PRN
  Administered 2016-08-01: 20 ug/min via INTRAVENOUS

## 2016-08-01 MED ORDER — ONDANSETRON HCL 4 MG PO TABS: 4.0000 mg | ORAL_TABLET | ORAL | Status: DC | PRN

## 2016-08-01 MED ORDER — BACITRACIN ZINC 500 UNIT/GM EX OINT
TOPICAL_OINTMENT | CUTANEOUS | Status: AC
  Filled 2016-08-01: qty 28.35

## 2016-08-01 MED ORDER — PROPOFOL 500 MG/50ML IV EMUL
INTRAVENOUS | Status: DC | PRN
  Administered 2016-08-01: 25 ug/kg/min via INTRAVENOUS

## 2016-08-01 MED ORDER — CEFAZOLIN SODIUM-DEXTROSE 2-4 GM/100ML-% IV SOLN
INTRAVENOUS | Status: AC
  Filled 2016-08-01: qty 100

## 2016-08-01 MED ORDER — HYDRALAZINE HCL 20 MG/ML IJ SOLN
5.0000 mg | INTRAMUSCULAR | Status: DC | PRN
  Administered 2016-08-01 – 2016-08-03 (×7): 10 mg via INTRAVENOUS
  Filled 2016-08-01 (×9): qty 1

## 2016-08-01 MED ORDER — BUPIVACAINE-EPINEPHRINE (PF) 0.5% -1:200000 IJ SOLN
INTRAMUSCULAR | Status: DC | PRN
  Administered 2016-08-01: 15 mL

## 2016-08-01 MED ORDER — NALOXONE HCL 0.4 MG/ML IJ SOLN: 0.0800 mg | INTRAMUSCULAR | Status: DC | PRN

## 2016-08-01 MED ORDER — ROCURONIUM BROMIDE 10 MG/ML (PF) SYRINGE
PREFILLED_SYRINGE | INTRAVENOUS | Status: DC | PRN
  Administered 2016-08-01: 20 mg via INTRAVENOUS
  Administered 2016-08-01 (×2): 50 mg via INTRAVENOUS

## 2016-08-01 MED ORDER — LIDOCAINE-EPINEPHRINE 2 %-1:100000 IJ SOLN
INTRAMUSCULAR | Status: AC
  Filled 2016-08-01: qty 1

## 2016-08-01 MED ORDER — FLEET ENEMA 7-19 GM/118ML RE ENEM
1.0000 | ENEMA | Freq: Once | RECTAL | Status: DC | PRN
Start: 2016-08-01 — End: 2016-08-03

## 2016-08-01 MED ORDER — THROMBIN 5000 UNITS EX SOLR
CUTANEOUS | Status: DC | PRN
  Administered 2016-08-01: 11:00:00 via TOPICAL

## 2016-08-01 MED ORDER — THROMBIN 20000 UNITS EX SOLR
CUTANEOUS | Status: AC
  Filled 2016-08-01: qty 20000

## 2016-08-01 MED ORDER — LIDOCAINE 2% (20 MG/ML) 5 ML SYRINGE
INTRAMUSCULAR | Status: DC | PRN
  Administered 2016-08-01: 40 mg via INTRAVENOUS

## 2016-08-01 MED ORDER — THROMBIN 20000 UNITS EX SOLR
CUTANEOUS | Status: DC | PRN
  Administered 2016-08-01: 11:00:00 via TOPICAL

## 2016-08-01 MED ORDER — CHLORHEXIDINE GLUCONATE 0.12% ORAL RINSE (MEDLINE KIT): 15.0000 mL | Freq: Two times a day (BID) | OROMUCOSAL | Status: DC

## 2016-08-01 MED ORDER — FENTANYL CITRATE (PF) 100 MCG/2ML IJ SOLN
25.0000 ug | INTRAMUSCULAR | Status: AC | PRN
  Administered 2016-08-01 – 2016-08-03 (×6): 50 ug via INTRAVENOUS
  Filled 2016-08-01 (×6): qty 2

## 2016-08-01 MED ORDER — ALBUTEROL SULFATE (2.5 MG/3ML) 0.083% IN NEBU: 2.5000 mg | INHALATION_SOLUTION | RESPIRATORY_TRACT | Status: DC | PRN

## 2016-08-01 MED ORDER — THROMBIN 5000 UNITS EX SOLR
CUTANEOUS | Status: AC
  Filled 2016-08-01: qty 5000

## 2016-08-01 MED ORDER — BACITRACIN ZINC 500 UNIT/GM EX OINT
TOPICAL_OINTMENT | CUTANEOUS | Status: DC | PRN
  Administered 2016-08-01: 1 via TOPICAL

## 2016-08-01 MED ORDER — SODIUM CHLORIDE 0.9 % IV SOLN
1000.0000 mg | Freq: Once | INTRAVENOUS | Status: AC
  Administered 2016-08-01: 1000 mg via INTRAVENOUS
  Filled 2016-08-01: qty 10

## 2016-08-01 MED ORDER — SODIUM CHLORIDE 0.9 % IV SOLN
INTRAVENOUS | Status: DC
  Administered 2016-08-01: 1000 mL via INTRAVENOUS
  Administered 2016-08-01: 13:00:00 via INTRAVENOUS
  Administered 2016-08-02: 1000 mL via INTRAVENOUS
  Administered 2016-08-02 – 2016-08-03 (×2): via INTRAVENOUS

## 2016-08-01 MED ORDER — CEFAZOLIN SODIUM-DEXTROSE 2-4 GM/100ML-% IV SOLN
2.0000 g | Freq: Three times a day (TID) | INTRAVENOUS | Status: DC
  Administered 2016-08-01 – 2016-08-03 (×6): 2 g via INTRAVENOUS
  Filled 2016-08-01 (×7): qty 100

## 2016-08-01 SURGICAL SUPPLY — 77 items
BATTERY IQ STERILE (MISCELLANEOUS) ×3 IMPLANT
BLADE CLIPPER SURG (BLADE) ×3 IMPLANT
BLADE ULTRA TIP 2M (BLADE) IMPLANT
BNDG GAUZE ELAST 4 BULKY (GAUZE/BANDAGES/DRESSINGS) IMPLANT
BUR ACORN 6.0 PRECISION (BURR) ×2 IMPLANT
BUR ACORN 6.0MM PRECISION (BURR) ×1
BUR MATCHSTICK NEURO 3.0 LAGG (BURR) ×3 IMPLANT
BUR SPIRAL ROUTER 2.3 (BUR) ×4 IMPLANT
BUR SPIRAL ROUTER 2.3MM (BUR) ×2
CANISTER SUCT 3000ML PPV (MISCELLANEOUS) ×3 IMPLANT
CARTRIDGE OIL MAESTRO DRILL (MISCELLANEOUS) ×1 IMPLANT
CHLORAPREP W/TINT 26ML (MISCELLANEOUS) ×3 IMPLANT
CLIP TI MEDIUM 6 (CLIP) ×3 IMPLANT
DIFFUSER DRILL AIR PNEUMATIC (MISCELLANEOUS) ×3 IMPLANT
DRAIN HEMOVAC 1/8 X 5 (WOUND CARE) ×3 IMPLANT
DRAPE NEUROLOGICAL W/INCISE (DRAPES) ×3 IMPLANT
DRAPE SHEET LG 3/4 BI-LAMINATE (DRAPES) ×6 IMPLANT
DRAPE SURG 17X23 STRL (DRAPES) IMPLANT
DRAPE WARM FLUID 44X44 (DRAPE) ×3 IMPLANT
DRSG MEPILEX BORDER 4X8 (GAUZE/BANDAGES/DRESSINGS) ×6 IMPLANT
ELECT REM PT RETURN 9FT ADLT (ELECTROSURGICAL) ×3
ELECTRODE REM PT RTRN 9FT ADLT (ELECTROSURGICAL) ×1 IMPLANT
EVACUATOR 1/8 PVC DRAIN (DRAIN) IMPLANT
EVACUATOR SILICONE 100CC (DRAIN) ×3 IMPLANT
GAUZE SPONGE 4X4 12PLY STRL (GAUZE/BANDAGES/DRESSINGS) ×3 IMPLANT
GAUZE SPONGE 4X4 16PLY XRAY LF (GAUZE/BANDAGES/DRESSINGS) IMPLANT
GLOVE BIOGEL PI IND STRL 7.5 (GLOVE) ×5 IMPLANT
GLOVE BIOGEL PI IND STRL 8 (GLOVE) ×3 IMPLANT
GLOVE BIOGEL PI INDICATOR 7.5 (GLOVE) ×10
GLOVE BIOGEL PI INDICATOR 8 (GLOVE) ×6
GLOVE ECLIPSE 7.5 STRL STRAW (GLOVE) ×3 IMPLANT
GLOVE EXAM NITRILE LRG STRL (GLOVE) ×3 IMPLANT
GLOVE SS BIOGEL STRL SZ 7.5 (GLOVE) ×2 IMPLANT
GLOVE SUPERSENSE BIOGEL SZ 7.5 (GLOVE) ×4
GOWN STRL REUS W/ TWL LRG LVL3 (GOWN DISPOSABLE) ×1 IMPLANT
GOWN STRL REUS W/ TWL XL LVL3 (GOWN DISPOSABLE) IMPLANT
GOWN STRL REUS W/TWL 2XL LVL3 (GOWN DISPOSABLE) ×6 IMPLANT
GOWN STRL REUS W/TWL LRG LVL3 (GOWN DISPOSABLE) ×3
GOWN STRL REUS W/TWL XL LVL3 (GOWN DISPOSABLE)
HEMOSTAT POWDER KIT SURGIFOAM (HEMOSTASIS) ×3 IMPLANT
HEMOSTAT SURGICEL 2X14 (HEMOSTASIS) IMPLANT
KIT BASIN OR (CUSTOM PROCEDURE TRAY) ×3 IMPLANT
KIT ROOM TURNOVER OR (KITS) ×3 IMPLANT
NEEDLE HYPO 21X1.5 SAFETY (NEEDLE) ×3 IMPLANT
NEEDLE HYPO 25X1 1.5 SAFETY (NEEDLE) ×3 IMPLANT
NS IRRIG 1000ML POUR BTL (IV SOLUTION) ×6 IMPLANT
OIL CARTRIDGE MAESTRO DRILL (MISCELLANEOUS) ×3
PACK CRANIOTOMY (CUSTOM PROCEDURE TRAY) ×3 IMPLANT
PATTIES SURGICAL .5 X.5 (GAUZE/BANDAGES/DRESSINGS) IMPLANT
PATTIES SURGICAL .5 X3 (DISPOSABLE) IMPLANT
PATTIES SURGICAL .5X1.5 (GAUZE/BANDAGES/DRESSINGS) IMPLANT
PATTIES SURGICAL 1X1 (DISPOSABLE) IMPLANT
PERFORATOR LRG  14-11MM (BIT) ×2
PERFORATOR LRG 14-11MM (BIT) ×1 IMPLANT
PIN MAYFIELD SKULL DISP (PIN) ×3 IMPLANT
PLATE 1.5  2HOLE LNG NEURO (Plate) ×8 IMPLANT
PLATE 1.5 2HOLE LNG NEURO (Plate) ×4 IMPLANT
SCREW SELF DRILL HT 1.5/4MM (Screw) ×24 IMPLANT
SPONGE NEURO XRAY DETECT 1X3 (DISPOSABLE) IMPLANT
SPONGE SURGIFOAM ABS GEL 100 (HEMOSTASIS) ×3 IMPLANT
SPONGE SURGIFOAM ABS GEL 100C (HEMOSTASIS) ×3 IMPLANT
STAPLER VISISTAT 35W (STAPLE) ×3 IMPLANT
STOCKINETTE 6  STRL (DRAPES) ×2
STOCKINETTE 6 STRL (DRAPES) ×1 IMPLANT
SUT NURALON 4 0 TR CR/8 (SUTURE) ×6 IMPLANT
SUT STRATAFIX SPIRAL + 2-0 (SUTURE) ×6 IMPLANT
SUT VIC AB 0 CT1 18XCR BRD8 (SUTURE) ×2 IMPLANT
SUT VIC AB 0 CT1 8-18 (SUTURE) ×4
SUT VIC AB 2-0 CT1 18 (SUTURE) ×6 IMPLANT
SYR 30ML LL (SYRINGE) ×6 IMPLANT
TOWEL OR 17X24 6PK STRL BLUE (TOWEL DISPOSABLE) ×3 IMPLANT
TOWEL OR 17X26 10 PK STRL BLUE (TOWEL DISPOSABLE) ×3 IMPLANT
TRAY FOLEY W/METER SILVER 16FR (SET/KITS/TRAYS/PACK) ×3 IMPLANT
TUBE CONNECTING 12'X1/4 (SUCTIONS) ×1
TUBE CONNECTING 12X1/4 (SUCTIONS) ×2 IMPLANT
UNDERPAD 30X30 (UNDERPADS AND DIAPERS) ×3 IMPLANT
WATER STERILE IRR 1000ML POUR (IV SOLUTION) ×3 IMPLANT

## 2016-08-01 NOTE — Progress Notes (Signed)
OT Cancellation Note  Patient Details Name: [name] MRN: [id number] DOB: 03/27/1929   Cancelled Treatment:    Reason Eval/Treat Not Completed: Patient at procedure or test/ unavailable (OR today). Will follow up for OT eval post op as time allows.  Binnie Kand  M.S., OTR/L Pager: [phone]  07/25/2016, 8:52 AM

## 2016-08-01 NOTE — Progress Notes (Signed)
PT Cancellation Note  Patient Details Name: [name] MRN: [id number] DOB: 1928-10-08   Cancelled Treatment:    Reason Eval/Treat Not Completed: Patient at procedure or test/unavailable.  Patient to OR today for craniotomy.  Will return at later date when appropriate for patient post-op.   Despina Pole 07/18/2016, 9:46 AM Carita Pian. Sanjuana Kava, [place] Pager [phone]

## 2016-08-01 NOTE — Consult Note (Signed)
PULMONARY / CRITICAL CARE MEDICINE   Name: [name] MRN: [id number] DOB: October 25, 1928    ADMISSION DATE:  07/22/2016 CONSULTATION DATE:  07/28/2016  REFERRING MD:  Dr. Cyndy Freeze   CHIEF COMPLAINT:  Vent support   HISTORY OF PRESENT ILLNESS:   HPI: [name] is a 81 y.o. male with a past medical history significant for HTN, HLD, CAD, and laryngeal cancer, presented to ER 08/07/2016 with complaints of weakness and confusion that has resulted in recurrent falls. His first fall was three weeks ago (hit his head in the back), he then had a severe headache over the weekend. He fell once again 11/17  sideways hitting the side of his right head (witnessed by wife), after which he became more confused and unable to ambulate. Head CT revealed bilateral subdural hematomas, right greater than left. Neurology was consulted and recommended transfer to [facility] for neurosurgeon evaluation.  He was taken to OR on 11/18 for right craniotomy for evacuation of subdural hematoma . He was brought to ICU on vent . PCCM to assume care.    PAST MEDICAL HISTORY :  He  has a past medical history of Aortic valve sclerosis; Coronary artery disease (2008); Hyperlipidemia; Hypertension; Laryngeal cancer ([place]); Pyelonephritis; and Stroke ([place]).  PAST SURGICAL HISTORY: He  has a past surgical history that includes Cholecystectomy and heart stents.  Allergies  Allergen Reactions  . Ciprofloxacin Other (See Comments)    unknown  . Statins     Intolerant causing myalgias    No current facility-administered medications on file prior to encounter.    Current Outpatient Prescriptions on File Prior to Encounter  Medication Sig  . isosorbide dinitrate (ISORDIL) 10 MG tablet take 1 tablet by mouth three times a day  . Multiple Vitamins-Minerals (MULTIVITAMIN WITH MINERALS) tablet Take 1 tablet by mouth daily.    . Potassium 99 MG TABS Take 1 tablet by mouth daily.    . Tamsulosin HCl (FLOMAX) 0.4 MG CAPS Take 0.4 mg by  mouth daily after supper.    . traMADol-acetaminophen (ULTRACET) 37.5-325 MG per tablet Take 1 tablet by mouth every 4 (four) hours as needed for pain.    FAMILY HISTORY:  His @FAMSTP (<SUBSCRIPT> error)@  SOCIAL HISTORY: He  reports that he has never smoked. He has never used smokeless tobacco. He reports that he does not drink alcohol or use drugs.  REVIEW OF SYSTEMS:   Unable to obtain , on vent unresponsive   SUBJECTIVE:  Unresponsive on vent   VITAL SIGNS: BP (!) 179/72   Pulse 81   Temp 97.3 F (36.3 C) (Axillary)   Resp 14   Ht 5\' 10"  (1.778 m)   Wt 89.4 kg (197 lb)   SpO2 100%   BMI 28.27 kg/m   HEMODYNAMICS:    VENTILATOR SETTINGS: Vent Mode: PRVC FiO2 (%):  [60 %] 60 % Set Rate:  [14 bmp] 14 bmp Vt Set:  [500 mL] 500 mL PEEP:  [5 cmH20] 5 cmH20  INTAKE / OUTPUT: I/O last 3 completed shifts: In: 1010 [I.V.:10; Blood:1000] Out: -   PHYSICAL EXAMINATION: General:  Unresponsive on vent  Neuro:  Unresponsive , not following commands , drain in place  HEENT:  ETT  Cardiovascular:  RRR  Lungs: Decreased BS in bases  Abdomen: Soft , hypoactive BS  Musculoskeletal:  intact Skin:  Intact w/o rash.   LABS:  BMET  Recent Labs Lab 07/23/2016 1029 07/31/16 0435 08/13/2016 0446  NA 138 137 138  K 4.1  3.9 3.6  CL 103 101 103  CO2 27 26 24   BUN 15 13 18   CREATININE 0.85 0.80 0.92  GLUCOSE 94 128* 122*    Electrolytes  Recent Labs Lab 08/03/2016 1029 07/31/16 0435 07/31/2016 0446  CALCIUM 9.5 9.4 9.2    CBC  Recent Labs Lab 08/03/2016 1029 07/31/16 0435 07/17/2016 0446  WBC 7.9 8.6 12.0*  HGB 16.0 15.6 15.4  HCT 46.1 45.3 44.9  PLT 260 272 324    Coag's  Recent Labs Lab 07/31/16 0904  INR 1.01    Sepsis Markers  Recent Labs Lab 08/05/2016 1031 07/29/2016 1341  LATICACIDVEN 1.7 1.0    ABG  Recent Labs Lab 08/05/2016 1334  PHART 7.399  PCO2ART 40.0  PO2ART 227.0*    Liver Enzymes  Recent Labs Lab 07/20/2016 1029  AST 30   ALT 16*  ALKPHOS 55  BILITOT 1.5*  ALBUMIN 4.2    Cardiac Enzymes  Recent Labs Lab 07/21/2016 1029  TROPONINI <0.03    Glucose  Recent Labs Lab 07/18/2016 1214  GLUCAP 115*    Imaging No results found.   STUDIES:  CT head 11/6>bilateral subdural hematomas R>L , large right acute on chronic fluid w/ substantial mass effect w/ 1.4cm shift  CT >similar w/ large Right sided fluid collection   CULTURES: UC 11/16 >mult species  BC x 2 11/16 >pend   ANTIBIOTICS: Ancef 11/18   SIGNIFICANT EVENTS: OR 11/18 for craniotomy for subdural hematoma evacuation  LINES/TUBES: 11/18 ETT >>   DISCUSSION: 82 yo male with previous CVA in 04/2016 discharged on plavix and asa admitted with bilateral R>L subdural hematomas after recurrent falls s/p craniotomy with subdural evacuation   ASSESSMENT / PLAN:  PULMONARY A: Need for vent support -unable to protect airway due to mental state P:   Vent support  Check abg  VAP  Assess daily SBT/Wean as able  Check cxr   CARDIOVASCULAR A:  Hypertension  CAD   P:  Plavix/Asa on hold  Metoprolol As needed   Hydralazine As needed   Cont on nitro patch -if b/p drops will need to d/c   RENAL A:    P:   Trend BMET  Replace electrolytes as indicated  Cont IVF NS at 100cc   GASTROINTESTINAL A:   NPO  P:   Will TF in am if unable to extubate  GI ppx   HEMATOLOGIC A:   No active issues  P:  Tr CBC   INFECTIOUS A:   Leukocytosis ? PNA  P:   Cont IV abx  Follow cx data  Check CXR  Tr wbc and temp     ENDOCRINE A:    P:   Monitor BS on chem, add SSI if elevated   NEUROLOGIC A:   Subdural Hematoma s/p right craniotomy w/ evacuation 11/18  P:   RASS goal: 0 to -1  Avoid oversedation  Cont keprra  NS followiing  Propofol  Fent As needed    FAMILY  - Updates:   - Inter-disciplinary family meet or Palliative Care meeting due by:  08/08/16    Rexene Edison NP-C  Pulmonary and [place] Pager: [phone]  07/30/2016, 1:49 PM  STAFF NOTE: Linwood Dibbles, MD FACP have personally reviewed patient's available data, including medical history, events of note, physical examination and test results as part of my evaluation. I have discussed with resident/NP and other care providers such as pharmacist, RN and RRT. In addition, I  personally evaluated patient and elicited key findings of: not responsive post op, dressing dry, lungs clear, reviewed all prior scans, seems his neurostatus was poor pre op, opted for drainage, now post op resp failure, get stat abg on current MV, stat pcxr for ett, he remains HTN, HY 65, add hydral to goal sys 150, consider early feeding, [place] e short acting propofol and re assess BP  Needs, pepcid on vent, ssi The patient is critically ill with multiple organ systems failure and requires high complexity decision making for assessment and support, frequent evaluation and titration of therapies, application of advanced monitoring technologies and extensive interpretation of multiple databases.   Critical Care Time devoted to patient care services described in this note is 35 Minutes. This time reflects time of care of this signee: Merrie Roof, MD FACP. This critical care time does not reflect procedure time, or teaching time or supervisory time of PA/NP/Med student/Med Resident etc but could involve care discussion time. Rest per NP/medical resident whose note is outlined above and that I agree with   Lavon Paganini. Titus Mould, MD, [place] Pgr: [place] [facility] 08/07/2016 3:10 PM

## 2016-08-01 NOTE — Progress Notes (Signed)
Pt transferring to OR at this time. No noted distress.  Family at bedside.

## 2016-08-01 NOTE — OR Nursing (Signed)
Condom catheter removed at 1000, foley catheter was placed without incident after removal

## 2016-08-01 NOTE — Anesthesia Procedure Notes (Signed)
Procedure Name: Intubation Date/Time: 07/24/2016 9:54 AM Performed by: Garrison [place] T Pre-anesthesia Checklist: Patient identified, Emergency Drugs available, Suction available and Patient being monitored Patient Re-evaluated:Patient Re-evaluated prior to inductionOxygen Delivery Method: Circle System Utilized Preoxygenation: Pre-oxygenation with 100% oxygen Intubation Type: IV induction Ventilation: Mask ventilation without difficulty and Oral airway inserted - appropriate to patient size Laryngoscope Size: Glidescope and 4 Grade View: Grade I Tube type: Subglottic suction tube Tube size: 7.5 mm Number of attempts: 1 Airway Equipment and Method: Stylet and Oral airway Placement Confirmation: ETT inserted through vocal cords under direct vision,  positive ETCO2 and breath sounds checked- equal and bilateral Secured at: 23 cm Tube secured with: Tape Dental Injury: Teeth and Oropharynx as per pre-operative assessment

## 2016-08-01 NOTE — Anesthesia Preprocedure Evaluation (Addendum)
Anesthesia Evaluation  Patient identified by MRN, date of birth, ID band Patient unresponsive    Reviewed: Allergy & Precautions, NPO status , Patient's Chart, lab work & pertinent test results  Airway Mallampati: II  TM Distance: >3 FB     Dental  (+) Dental Advisory Given, Partial Upper, Partial Lower   Pulmonary     + decreased breath sounds      Cardiovascular hypertension, + CAD, + Past MI and + Cardiac Stents   Rhythm:Regular Rate:Normal + Systolic murmurs    Neuro/Psych CVA, Residual Symptoms    GI/Hepatic   Endo/Other    Renal/GU      Musculoskeletal   Abdominal   Peds  Hematology   Anesthesia Other Findings   Reproductive/Obstetrics                            Anesthesia Physical Anesthesia Plan  ASA: IV and emergent  Anesthesia Plan: General   Post-op Pain Management:    Induction: Intravenous  Airway Management Planned: Oral ETT  Additional Equipment: Arterial line  Intra-op Plan:   Post-operative Plan: Post-operative intubation/ventilation  Informed Consent: I have reviewed the patients History and Physical, chart, labs and discussed the procedure including the risks, benefits and alternatives for the proposed anesthesia with the patient or authorized representative who has indicated his/her understanding and acceptance.   Dental advisory given  Plan Discussed with: CRNA and Anesthesiologist  Anesthesia Plan Comments:         Anesthesia Quick Evaluation

## 2016-08-01 NOTE — Procedures (Signed)
Electroencephalogram (EEG) Report  Date of study: 08/11/2016  Requesting clinician: Berle Mull, MD  Reason for study: Evaluate for seizure  Brief clinical history: This is an  75-yo man with bilateral SDH s/p evacuation. EEG for further evaluation.   Medications:  Current Facility-Administered Medications:  .  0.9 %  sodium chloride infusion, , Intravenous, Once, Consuella Lose, MD .  0.9 %  sodium chloride infusion, , Intravenous, Continuous, Kevan Ny Ditty, MD, Last Rate: 100 mL/hr at 07/20/2016 1230 .  acetaminophen (TYLENOL) tablet 650 mg, 650 mg, Oral, Q6H PRN **OR** acetaminophen (TYLENOL) suppository 650 mg, 650 mg, Rectal, Q6H PRN, Erline Hau, MD, 650 mg at 07/28/2016 0313 .  albuterol (PROVENTIL) (2.5 MG/3ML) 0.083% nebulizer solution 2.5 mg, 2.5 mg, Nebulization, Q2H PRN, Tammy S Parrett, NP .  bisacodyl (DULCOLAX) EC tablet 5 mg, 5 mg, Oral, Daily PRN, Kevan Ny Ditty, MD .  bisacodyl (DULCOLAX) suppository 10 mg, 10 mg, Rectal, Daily PRN, Lavina Hamman, MD .  ceFAZolin (ANCEF) IVPB 2g/100 mL premix, 2 g, Intravenous, Q8H, Kevan Ny Ditty, MD, 2 g at 08/03/2016 1805 .  chlorhexidine gluconate (MEDLINE KIT) (PERIDEX) 0.12 % solution 15 mL, 15 mL, Mouth Rinse, BID, Kevan Ny Ditty, MD .  docusate sodium (COLACE) capsule 100 mg, 100 mg, Oral, BID, Kevan Ny Ditty, MD .  fentaNYL (SUBLIMAZE) injection 25-50 mcg, 25-50 mcg, Intravenous, Q5 min PRN, Roberts Gaudy, MD, 50 mcg at 07/29/2016 1652 .  hydrALAZINE (APRESOLINE) injection 5-10 mg, 5-10 mg, Intravenous, Q1H PRN, Kevan Ny Ditty, MD, 10 mg at 08/03/2016 1650 .  levETIRAcetam (KEPPRA) 500 mg in sodium chloride 0.9 % 100 mL IVPB, 500 mg, Intravenous, Q12H, Kevan Ny Ditty, MD .  MEDLINE mouth rinse, 15 mL, Mouth Rinse, 10 times per day, Tammy S Parrett, NP, 15 mL at 08/09/2016 1811 .  metoprolol (LOPRESSOR) injection 2.5 mg, 2.5 mg, Intravenous, Q8H, Lavina Hamman, MD, 2.5 mg at 07/23/2016  1438 .  naloxone (NARCAN) injection 0.08 mg, 0.08 mg, Intravenous, PRN, Kevan Ny Ditty, MD .  nitroGLYCERIN (NITROGLYN) 2 % ointment 1 inch, 1 inch, Topical, Q6H, Lavina Hamman, MD, 1 inch at 07/15/2016 0543 .  ondansetron (ZOFRAN) tablet 4 mg, 4 mg, Oral, Q4H PRN **OR** ondansetron (ZOFRAN) injection 4 mg, 4 mg, Intravenous, Q4H PRN, Kevan Ny Ditty, MD .  pantoprazole (PROTONIX) injection 40 mg, 40 mg, Intravenous, QHS, Kevan Ny Ditty, MD .  promethazine (PHENERGAN) tablet 12.5-25 mg, 12.5-25 mg, Oral, Q4H PRN, Kevan Ny Ditty, MD .  propofol (DIPRIVAN) 1000 MG/100ML infusion, 0-50 mcg/kg/min, Intravenous, Continuous, Tammy S Parrett, NP, Last Rate: 5.4 mL/hr at 07/27/2016 1620, 10 mcg/kg/min at 08/02/2016 1620 .  senna (SENOKOT) tablet 8.6 mg, 1 tablet, Oral, BID, Kevan Ny Ditty, MD .  sodium phosphate (FLEET) 7-19 GM/118ML enema 1 enema, 1 enema, Rectal, Once PRN, Tamala Fothergill, MD  Description: This is a routine EEG performed using standard international 10-20 electrode placement. A total of 18 channels are recorded, including one for the EKG. The patient is drowsy.   Activating Maneuvers: None  Findings:  The EKG channel demonstrates a regular rhythm with a rate of  beats per minute.   The background consists of moderate slowing in the theta range, average frequency 5-6 Hz. There is intermixed delta throughout the recording. This background shows fair reactivity.   There is greater slowing on the right side compared to the left. No epileptiform discharges are present. No seizures are recorded.     Impression:  This is an abnormal EEG due to moderate diffuse generalized slowing. This is consistent with a global encephalopathy, nonspecific as to etiology. More focal slowing on the right is consistent with reported history of a larger SDH on that side.   There is no seizure activity. No epileptiform discharges are present to suggest a propensity for seizure.     Melba Coon, MD [facility]

## 2016-08-01 NOTE — Anesthesia Postprocedure Evaluation (Signed)
Anesthesia Post Note  Patient: [name]  Procedure(s) Performed: Procedure(s) (LRB): CRANIOTOMY HEMATOMA EVACUATION SUBDURAL (Right)  Patient location during evaluation: NICU Anesthesia Type: General Level of consciousness: patient remains intubated per anesthesia plan Vital Signs Assessment: post-procedure vital signs reviewed and stable Respiratory status: patient remains intubated per anesthesia plan and patient on ventilator - see flowsheet for VS Cardiovascular status: blood pressure returned to baseline Anesthetic complications: no    Last Vitals:  Vitals:   07/25/2016 1630 07/28/2016 1654  BP: (!) 155/70 (!) 159/74  Pulse: 78 84  Resp: 15 15  Temp:      Last Pain:  Vitals:   08/05/2016 1200  TempSrc: Axillary  PainSc:                  [name]

## 2016-08-01 NOTE — Transfer of Care (Signed)
Immediate Anesthesia Transfer of Care Note  Patient: [name]  Procedure(s) Performed: Procedure(s): CRANIOTOMY HEMATOMA EVACUATION SUBDURAL (Right)  Patient Location: ICU  Anesthesia Type:General  Level of Consciousness: sedated, unresponsive and Patient remains intubated per anesthesia plan  Airway & Oxygen Therapy: Patient remains intubated per anesthesia plan and Patient placed on Ventilator (see vital sign flow sheet for setting)  Post-op Assessment: Report given to RN and Post -op Vital signs reviewed and stable  Post vital signs: Reviewed and stable  Last Vitals:  Vitals:   08/08/2016 0543 08/06/2016 0819  BP: (!) 162/82 (!) 168/82  Pulse: 88 75  Resp: 17 18  Temp: 37 C 36.8 C    Last Pain:  Vitals:   07/26/2016 0819  TempSrc: Axillary  PainSc: Asleep         Complications: No apparent anesthesia complications

## 2016-08-01 NOTE — Plan of Care (Signed)
[facility] PROGRESS NOTE  Patient: [name]   PCP: Alonza Bogus, MD DOB: 27-Feb-1929   DOA: 07/31/2016   DOS: 07/21/2016    Pt will be transferred to ICU service after craniotomy. Discussed with Dr Titus Mould, appreciate input.  Author: Berle Mull, MD [facility] Pager: [phone] 08/03/2016 2:06 PM

## 2016-08-01 NOTE — Progress Notes (Signed)
No acute events Somnolent, not arousable Moving both sides spontaneously Received two units of platelets To OR today for craniotomy Had a long discussion with the family regarding the risks and benefits of surgery as well as alternatives.  They understand and wish to proceed.

## 2016-08-01 NOTE — Op Note (Signed)
07/21/2016 - 08/03/2016  11:40 AM  PATIENT:  [name]  82 y.o. male  PRE-OPERATIVE DIAGNOSIS:  Chronic subdural hematoma, right  POST-OPERATIVE DIAGNOSIS:  Same  PROCEDURE:  Right craniotomy for evacuation of subdural hematoma; use of operating microscope  SURGEON:  Aldean Ast, MD  ASSISTANTS: None  ANESTHESIA:   General  DRAINS: JP in the subdural space   SPECIMEN:  None  INDICATION FOR PROCEDURE: 82 year male with large right sided subdural hematoma. Patient was unresponsive and the family understood the risks, benefits, and alternatives and potential outcomes and wished to proceed.  PROCEDURE DETAILS: After smooth induction of general endotracheal anesthesia the patient's head was fixated in [place] pins turned to the left. The scalp was clipped of hair over the region of the planned incision. The skin was wiped down with alcohol. Lidocaine and Marcaine with epinephrine was injected along the line of the planned incision. The patient was prepped and draped in usual sterile fashion.  A reverse question mark shape incision was made on the right side. Raney clips were placed the skin edges. The pericranium and temporalis was scraped back with periosteal elevators. The musculocutaneous flap was reflected anteriorly. Four burr holes were fashioned. A craniotomy was turned which involved the entire exposed skull.  The dura was opened sharply. There was dark fluid under tension. There were some adhesions apparent.  The operating microscope was draped and brought into the field.  Using microsurgical technique the adhesions were sharply dissected.  I thoroughly irrigated the subdural space. There was no further subdural fluid. Hemostasis was obtained with bipolar cautery and topical hemostatic agents. A JP drain was placed in the subdural space and tunneled out through the skin.  The dura was closed loosely. Tack up holes were placed in the craniotomy flap. Tack up sutures  were passed from the dura through the tack up holes on the craniotomy flap. The craniotomy flap was fixated to the skull with titanium plates. We tack up sutures were tied.  I irrigated vigorously again. The wound was closed in routine anatomic layers. The skin was closed with staples. The patient was taken out of [place] pins.  He was taken to the ICU intubated and in stable condition.  PATIENT DISPOSITION:  ICU - intubated and hemodynamically stable.   Delay start of Pharmacological VTE agent (>24hrs) due to surgical blood loss or risk of bleeding:  yes

## 2016-08-02 ENCOUNTER — Inpatient Hospital Stay (HOSPITAL_COMMUNITY): Payer: Medicare Other

## 2016-08-02 DIAGNOSIS — J9600 Acute respiratory failure, unspecified whether with hypoxia or hypercapnia: Secondary | ICD-10-CM

## 2016-08-02 LAB — CBC
HEMATOCRIT: 38.2 % — AB (ref 39.0–52.0)
Hemoglobin: 12.9 g/dL — ABNORMAL LOW (ref 13.0–17.0)
MCH: 32.3 pg (ref 26.0–34.0)
MCHC: 33.8 g/dL (ref 30.0–36.0)
MCV: 95.5 fL (ref 78.0–100.0)
Platelets: 232 10*3/uL (ref 150–400)
RBC: 4 MIL/uL — ABNORMAL LOW (ref 4.22–5.81)
RDW: 13.3 % (ref 11.5–15.5)
WBC: 14.6 10*3/uL — ABNORMAL HIGH (ref 4.0–10.5)

## 2016-08-02 LAB — BASIC METABOLIC PANEL
Anion gap: 7 (ref 5–15)
BUN: 18 mg/dL (ref 6–20)
CALCIUM: 8.6 mg/dL — AB (ref 8.9–10.3)
CO2: 23 mmol/L (ref 22–32)
Chloride: 107 mmol/L (ref 101–111)
Creatinine, Ser: 0.8 mg/dL (ref 0.61–1.24)
GFR calc Af Amer: >60 mL/min (ref >60)
GLUCOSE: 133 mg/dL — AB (ref 65–99)
Potassium: 3.6 mmol/L (ref 3.5–5.1)
Sodium: 137 mmol/L (ref 135–145)

## 2016-08-02 LAB — GLUCOSE, CAPILLARY
GLUCOSE-CAPILLARY: 112 mg/dL — AB (ref 65–99)
GLUCOSE-CAPILLARY: 105 mg/dL — AB (ref 65–99)
GLUCOSE-CAPILLARY: 131 mg/dL — AB (ref 65–99)
Glucose-Capillary: 96 mg/dL (ref 65–99)
Glucose-Capillary: 109 mg/dL — ABNORMAL HIGH (ref 65–99)
Glucose-Capillary: 118 mg/dL — ABNORMAL HIGH (ref 65–99)

## 2016-08-02 LAB — MAGNESIUM
Magnesium: 2.1 mg/dL (ref 1.7–2.4)
Magnesium: 2.2 mg/dL (ref 1.7–2.4)

## 2016-08-02 LAB — PHOSPHORUS
PHOSPHORUS: 3 mg/dL (ref 2.5–4.6)
PHOSPHORUS: 2.4 mg/dL — AB (ref 2.5–4.6)

## 2016-08-02 LAB — PROCALCITONIN: PROCALCITONIN: 0.2 ng/mL

## 2016-08-02 MED ORDER — VITAL HIGH PROTEIN PO LIQD
1000.0000 mL | ORAL | Status: DC
  Administered 2016-08-02 – 2016-08-03 (×2): 1000 mL

## 2016-08-02 MED ORDER — PRO-STAT SUGAR FREE PO LIQD
30.0000 mL | Freq: Two times a day (BID) | ORAL | Status: DC
  Administered 2016-08-02 – 2016-08-03 (×3): 30 mL
  Filled 2016-08-02 (×3): qty 30

## 2016-08-02 NOTE — Progress Notes (Signed)
Brief Nutrition Note  Consult received for enteral/tube feeding initiation and management.  Adult Enteral Nutrition Protocol initiated. Full assessment to follow.  Admitting Dx: Bilateral subdural hematomas ([place]) [id number] Community acquired pneumonia, unspecified laterality [J18.9]  Body mass index is 28.27 kg/m. Pt meets criteria for overweight based on current BMI.  Labs:   Recent Labs Lab 07/31/16 0435 08/03/2016 0446 08/02/16 0445  NA 137 138 137  K 3.9 3.6 3.6  CL 101 103 107  CO2 26 24 23   BUN 13 18 18   CREATININE 0.80 0.92 0.80  CALCIUM 9.4 9.2 8.6*  GLUCOSE 128* 122* 133*    Clayton Bibles, MS, RD, LDN Pager: [phone] After Hours Pager: [phone]

## 2016-08-02 NOTE — Progress Notes (Signed)
PULMONARY / CRITICAL CARE MEDICINE   Name: [name] MRN: [id number] DOB: [birth year]    ADMISSION DATE:  07/22/2016 CONSULTATION DATE:  08/03/2016  REFERRING MD:  Dr. Cyndy Freeze   CHIEF COMPLAINT:  Vent support   HISTORY OF PRESENT ILLNESS:   HPI: [name] is a 82 y.o. male with a past medical history significant for HTN, HLD, CAD, and laryngeal cancer, presented to ER 08/10/2016 with complaints of weakness and confusion that has resulted in recurrent falls. His first fall was three weeks ago (hit his head in the back), he then had a severe headache over the weekend. He fell once again 11/17  sideways hitting the side of his right head (witnessed by wife), after which he became more confused and unable to ambulate. Head CT revealed bilateral subdural hematomas, right greater than left. Neurology was consulted and recommended transfer to [facility] for neurosurgeon evaluation.  He was taken to OR on 11/18 for right craniotomy for evacuation of subdural hematoma . He was brought to ICU on vent . PCCM to assume care.    SUBJECTIVE:  Sedated/unresponsive on vent  EEG done last pm , no seizure, global enceph.  Fevers overnight   VITAL SIGNS: BP (!) 154/71   Pulse (!) 109   Temp 100.3 F (37.9 C) (Axillary)   Resp 18   Ht 5\' 10"  (1.778 m)   Wt 89.4 kg (197 lb)   SpO2 98%   BMI 28.27 kg/m   HEMODYNAMICS:    VENTILATOR SETTINGS: Vent Mode: PRVC FiO2 (%):  [30 %-60 %] 30 % Set Rate:  [14 bmp-15 bmp] 15 bmp Vt Set:  [500 mL] 500 mL PEEP:  [5 cmH20] 5 cmH20 Plateau Pressure:  [10 cmH20-13 cmH20] 13 cmH20  INTAKE / OUTPUT: I/O last 3 completed shifts: In: [id number] [I.V.:2920; Blood:600; IV Piggyback:810] Out: 2885 [Urine:2330; Drains:55; Blood:500]  PHYSICAL EXAMINATION: General:  Unresponsive on vent  Neuro:  Unresponsive , not following commands , drain in place  HEENT:  ETT  Cardiovascular:  RRR  Lungs: Decreased BS in bases  Abdomen: Soft , hypoactive BS  Musculoskeletal:   intact Skin:  Intact w/o rash.   LABS:  BMET  Recent Labs Lab 07/31/16 0435 07/20/2016 0446 08/02/16 0445  NA 137 138 137  K 3.9 3.6 3.6  CL 101 103 107  CO2 26 24 23   BUN 13 18 18   CREATININE 0.80 0.92 0.80  GLUCOSE 128* 122* 133*    Electrolytes  Recent Labs Lab 07/31/16 0435 07/15/2016 0446 08/02/16 0445  CALCIUM 9.4 9.2 8.6*    CBC  Recent Labs Lab 07/31/16 0435 08/03/2016 0446 08/02/16 0445  WBC 8.6 12.0* 14.6*  HGB 15.6 15.4 12.9*  HCT 45.3 44.9 38.2*  PLT 272 324 232    Coag's  Recent Labs Lab 07/31/16 0904  INR 1.01    Sepsis Markers  Recent Labs Lab 07/25/2016 1031 08/09/2016 1341 08/02/2016 1328 08/02/16 0445  LATICACIDVEN 1.7 1.0  --   --   PROCALCITON  --   --  <0.10 0.20    ABG  Recent Labs Lab 08/03/2016 1334  PHART 7.399  PCO2ART 40.0  PO2ART 227.0*    Liver Enzymes  Recent Labs Lab 07/20/2016 1029  AST 30  ALT 16*  ALKPHOS 55  BILITOT 1.5*  ALBUMIN 4.2    Cardiac Enzymes  Recent Labs Lab 07/22/2016 1029  TROPONINI <0.03    Glucose  Recent Labs Lab 08/11/2016 1214 07/27/2016 2030 08/10/2016 2313 08/02/16 0304 08/02/16 [id number]  GLUCAP 115* 92 90 112* 96    Imaging Ct Head Wo Contrast  Result Date: 08/02/2016 CLINICAL DATA:  Follow-up bilateral evacuated subdural hematomas. EXAM: CT HEAD WITHOUT CONTRAST TECHNIQUE: Contiguous axial images were obtained from the base of the skull through the vertex without intravenous contrast. COMPARISON:  Two days ago FINDINGS: Brain: Right cerebral subdural collection with low-density, high-density, and gas components. The collection is status post interval decompression. Volume of clot has increased from prior. Maximal collection measures up to 20 mm. A JP drain is present in the subdural space. Residual collection still causes mass effect and midline shift, 9 mm today compared to 14 mm previously. Small low-density collection around the left cerebral convexity measuring up to 11 mm  thickness in the parietal region. No acute infarct, including along the ACA territory. Remote left paramedian pontine infarct. No ventricular entrapment; left lateral ventricle volume has decreased in the temporal lobe compared to prior. Vascular: Atherosclerotic calcification.  No hyperdense vessel. Skull: Right scalp swelling along the craniotomy site. Sinuses/Orbits: Symmetric optic nerve sheath thickening. IMPRESSION: 1. Interval drainage of right cerebral convexity subdural hematoma with improved midline shift and left lateral ventricle entrapment. Residual subdural collection has an increased degree of clot component. Residual mass-effect causes midline shift of 9 mm. 2. Stable low-density left cerebral convexity subdural collection with mild parietal mass-effect. Electronically Signed   By: Monte Fantasia M.D.   On: 08/02/2016 07:46   Portable Chest Xray  Result Date: 08/02/2016 CLINICAL DATA:  Respiratory failure EXAM: PORTABLE CHEST 1 VIEW COMPARISON:  07/17/2016 FINDINGS: Endotracheal tube is in stable position. Interval placement of NG tube into the stomach. Elevation of the left hemidiaphragm with left base atelectasis. Minimal right base atelectasis. Heart is normal size. No visible effusions. IMPRESSION: Bibasilar atelectasis. Electronically Signed   By: Rolm Baptise M.D.   On: 08/02/2016 07:31   Dg Chest Port 1 View  Result Date: 07/23/2016 CLINICAL DATA:  Acute respiratory failure EXAM: PORTABLE CHEST 1 VIEW COMPARISON:  07/27/2016 FINDINGS: Endotracheal tube terminates 4.3 cm above carina.Left glenohumeral joint osteoarthritis is advanced. Midline trachea. Normal heart size. Atherosclerosis in the transverse aorta. Possible trace left pleural fluid. No pneumothorax. Clear right lung. Similar patchy left base airspace disease. IMPRESSION: Appropriate position of endotracheal tube, 4.3 cm above carina. Patchy left base atelectasis or infection, similar. Aortic atherosclerosis.  Electronically Signed   By: Abigail Miyamoto M.D.   On: 07/30/2016 14:16   Dg Abd Portable 1v  Result Date: 07/24/2016 CLINICAL DATA:  Orogastric tube placement EXAM: PORTABLE ABDOMEN - 1 VIEW COMPARISON:  None. FINDINGS: Orogastric tube tip and side port are in the stomach. There is moderate stool throughout colon. There is a paucity of small bowel gas. There is no bowel dilatation or air-fluid level suggesting bowel obstruction. No evident free air. There are surgical clips in the right upper quadrant. IMPRESSION: Orogastric tube tip and side port in stomach. Paucity of small bowel gas. This finding may be seen normally but also may be seen due to ileus or enteritis. Bowel obstruction felt not to be likely. No free air. Surgical clips noted in right upper quadrant. Electronically Signed   By: Lowella Grip III M.D.   On: 08/06/2016 15:32     STUDIES:  CT head 11/6>bilateral subdural hematomas R>L , large right acute on chronic fluid w/ substantial mass effect w/ 1.4cm shift  CT >similar w/ large Right sided fluid collection  EEG 11/18 >neg seiz, global enceph  CT head 11/19>interval drainage  of hematoma, w/ improved midline shift (28mm) , residual subdural collection has increased with increased degree of clot .   CULTURES: UC 11/16 >mult species  BC x 2 11/16 >pend   ANTIBIOTICS: Ancef 11/18   SIGNIFICANT EVENTS: OR 11/18 for craniotomy for subdural hematoma evacuation  LINES/TUBES: 11/18 ETT >>   DISCUSSION: 82 yo male with previous CVA in 04/2016 discharged on plavix and asa admitted with bilateral R>L subdural hematomas after recurrent falls s/p craniotomy with subdural evacuation 11/18  ASSESSMENT / PLAN:  PULMONARY A: Need for vent support -unable to protect airway due to mental state CAP  P:   Vent support  Check abg  VAP  Assess daily SBT/Wean as able  Tr cxr  Cont IV abx   CARDIOVASCULAR A:  Hypertension  CAD   P:  Plavix/Asa on hold  Metoprolol As needed    Hydralazine As needed   Cont on nitro patch -if b/p drops will need to d/c   RENAL A:    P:   Trend BMET  Replace electrolytes as indicated  Cont IVF NS at [place] A:   NPO  P:   Begin TF  GI ppx   HEMATOLOGIC A:   Mild anemia  P:  Tr CBC   INFECTIOUS A:   CAP   P:   Cont IV abx  Follow cx data  Check CXR  Tr wbc and temp     ENDOCRINE A:    P:   Monitor BS on chem, add SSI if elevated   NEUROLOGIC A:   Subdural Hematoma s/p right craniotomy w/ evacuation 11/18  P:   RASS goal: 0 to -1  Sedation protocol  Cont keprra  NS followiing  Fent As needed    FAMILY  - Updates:   - Inter-disciplinary family meet or Palliative Care meeting due by:  08/08/16    Rexene Edison NP-C  P[facility] [place] Pager: [phone]  08/02/2016, 8:13 AM   STAFF NOTE: I, Merrie Roof, MD FACP have personally reviewed patient's available data, including medical history, events of note, physical examination and test results as part of my evaluation. I have discussed with resident/NP and other care providers such as pharmacist, RN and RRT. In addition, I personally evaluated patient and elicited key findings of: not responding, not following commands, lungs clear, mild ATX left base on pcxr, no infiltrate, slight fever, ABG reviewed, keep same MV, on min support can SBT but unable to extubate given airway protection skills concerns, CT head reviewed, no free water, WUA mandatory, would follow pcx again in am , low threhsold for asp nosocomial abx The patient is critically ill with multiple organ systems failure and requires high complexity decision making for assessment and support, frequent evaluation and titration of therapies, application of advanced monitoring technologies and extensive interpretation of multiple databases.   Critical Care Time devoted to patient care services described in this note is 30  Minutes. This time reflects time of care of this signee: Merrie Roof, MD FACP. This critical care time does not reflect procedure time, or teaching time or supervisory time of PA/NP/Med student/Med Resident etc but could involve care discussion time. Rest per NP/medical resident whose note is outlined above and that I agree with   Lavon Paganini. Titus Mould, MD, [place] Pgr: [facility] 08/02/2016 11:59 AM

## 2016-08-02 NOTE — Progress Notes (Signed)
Sedated Not arousable Moving legs, but no purposeful upper extremity movement Bandage c/d/i CT shows some residual acute blood, but less mass effect and shift Suspect he will not have a good outcome but will continue full support for now

## 2016-08-02 NOTE — Progress Notes (Signed)
SLP Cancellation Note  Patient Details Name: [name] MRN: [id number] DOB: [birth year]   Cancelled treatment:    Patient intubated. Signing off. Please re-consult if appropriate.   [place], CCC-SLP [phone]        [name] 08/02/2016, 11:40 AM

## 2016-08-03 ENCOUNTER — Inpatient Hospital Stay (HOSPITAL_COMMUNITY): Payer: Medicare Other

## 2016-08-03 LAB — CBC
HCT: 37.4 % — ABNORMAL LOW (ref 39.0–52.0)
Hemoglobin: 12.2 g/dL — ABNORMAL LOW (ref 13.0–17.0)
MCH: 31.8 pg (ref 26.0–34.0)
MCHC: 32.6 g/dL (ref 30.0–36.0)
MCV: 97.4 fL (ref 78.0–100.0)
PLATELETS: 195 10*3/uL (ref 150–400)
RBC: 3.84 MIL/uL — AB (ref 4.22–5.81)
RDW: 13.4 % (ref 11.5–15.5)
WBC: 11.8 10*3/uL — AB (ref 4.0–10.5)

## 2016-08-03 LAB — PHOSPHORUS: Phosphorus: 2.1 mg/dL — ABNORMAL LOW (ref 2.5–4.6)

## 2016-08-03 LAB — BASIC METABOLIC PANEL
Anion gap: 8 (ref 5–15)
BUN: 26 mg/dL — AB (ref 6–20)
CO2: 23 mmol/L (ref 22–32)
Calcium: 8.4 mg/dL — ABNORMAL LOW (ref 8.9–10.3)
Chloride: 106 mmol/L (ref 101–111)
Creatinine, Ser: 0.84 mg/dL (ref 0.61–1.24)
GFR calc Af Amer: >60 mL/min (ref >60)
Glucose, Bld: 161 mg/dL — ABNORMAL HIGH (ref 65–99)
POTASSIUM: 3.5 mmol/L (ref 3.5–5.1)
SODIUM: 137 mmol/L (ref 135–145)

## 2016-08-03 LAB — GLUCOSE, CAPILLARY
GLUCOSE-CAPILLARY: 132 mg/dL — AB (ref 65–99)
GLUCOSE-CAPILLARY: 139 mg/dL — AB (ref 65–99)
Glucose-Capillary: 124 mg/dL — ABNORMAL HIGH (ref 65–99)

## 2016-08-03 LAB — MAGNESIUM: MAGNESIUM: 2.2 mg/dL (ref 1.7–2.4)

## 2016-08-03 LAB — PROCALCITONIN: PROCALCITONIN: 0.86 ng/mL

## 2016-08-03 MED ORDER — MORPHINE BOLUS VIA INFUSION
5.0000 mg | INTRAVENOUS | Status: DC | PRN
  Filled 2016-08-03: qty 20

## 2016-08-03 MED ORDER — SODIUM CHLORIDE 0.9 % IV SOLN
10.0000 mg/h | INTRAVENOUS | Status: DC
  Administered 2016-08-03: 4 mg/h via INTRAVENOUS
  Filled 2016-08-03: qty 10

## 2016-08-03 NOTE — Progress Notes (Signed)
[facility] Progress Note Patient Name: [name] DOB: 1928-09-15 MRN: [id number]   Date of Service  08/03/2016  HPI/Events of Note  Request to transfer patient to a palliative care bed. Patient is comfort measures. Family understands need to move the patient.   eICU Interventions  Will transfer to a Palliative Care bed.      Intervention Category Minor Interventions: Routine modifications to care plan (e.g. PRN medications for pain, fever)  [name] 08/03/2016, 5:37 PM

## 2016-08-03 NOTE — Progress Notes (Signed)
Family has decided to withdraw care and make pt comfort. CCMD paged. Monitoring closely. Decari Duggar, Rande Brunt, RN

## 2016-08-03 NOTE — Progress Notes (Signed)
Report given to RN on [facility]. Family aware of the transfer and are okay with it. Wife will update other family members of the move. Pt resting comfortably.  Reha Martinovich, Rande Brunt, RN

## 2016-08-03 NOTE — Progress Notes (Signed)
Patient extubated at 1500 and tolerated it well. Family at the bedside. Patient on 2LO2 [place], Morphine drip is running and patient seems comfortable at this time. Monitoring quietly. Shikara Mcauliffe, Rande Brunt, RN

## 2016-08-03 NOTE — Procedures (Signed)
Extubation Procedure Note  Patient Details:   Name: [name] DOB: 1929/04/01 MRN: [id number]   Airway Documentation:     Evaluation  O2 sats: currently acceptable Complications: No apparent complications Patient did tolerate procedure well. Bilateral Breath Sounds: Diminished   No  Gonzella Lex 08/03/2016, 2:46 PM

## 2016-08-03 NOTE — Progress Notes (Signed)
PT Cancellation Note  Patient Details Name: [name] MRN: [id number] DOB: 1929-07-15   Cancelled Treatment:    Reason Eval/Treat Not Completed: Medical issues which prohibited therapy Pt is sedated and ventilated. Will follow up.   Marguarite Arbour A Cornelio Parkerson 08/03/2016, 11:04 AM Wray Kearns, PT, DPT [phone]

## 2016-08-03 NOTE — Progress Notes (Signed)
PULMONARY / CRITICAL CARE MEDICINE   Name: [name] MRN: [id number] DOB: 1929-09-02    ADMISSION DATE:  07/26/2016 CONSULTATION DATE:  08/09/2016  REFERRING MD:  Dr. Cyndy Freeze   CHIEF COMPLAINT:  Vent support   HISTORY OF PRESENT ILLNESS:   HPI: [name] is a 82 y.o. male with a past medical history significant for HTN, HLD, CAD, and laryngeal cancer, presented to ER 07/28/2016 with complaints of weakness and confusion that has resulted in recurrent falls. His first fall was three weeks ago (hit his head in the back), he then had a severe headache over the weekend. He fell once again 11/17  sideways hitting the side of his right head (witnessed by wife), after which he became more confused and unable to ambulate. Head CT revealed bilateral subdural hematomas, right greater than left. Neurology was consulted and recommended transfer to [facility] for neurosurgeon evaluation.  He was taken to OR on 11/18 for right craniotomy for evacuation of subdural hematoma . He was brought to ICU on vent . PCCM to assume care.    PAST MEDICAL HISTORY :  He  has a past medical history of Aortic valve sclerosis; Coronary artery disease (2008); Hyperlipidemia; Hypertension; Laryngeal cancer ([place]); Pyelonephritis; and Stroke ([place]).  PAST SURGICAL HISTORY: He  has a past surgical history that includes Cholecystectomy and heart stents.  Allergies  Allergen Reactions  . Ciprofloxacin Other (See Comments)    unknown  . Statins     Intolerant causing myalgias    No current facility-administered medications on file prior to encounter.    Current Outpatient Prescriptions on File Prior to Encounter  Medication Sig  . isosorbide dinitrate (ISORDIL) 10 MG tablet take 1 tablet by mouth three times a day  . Multiple Vitamins-Minerals (MULTIVITAMIN WITH MINERALS) tablet Take 1 tablet by mouth daily.    . Potassium 99 MG TABS Take 1 tablet by mouth daily.    . Tamsulosin HCl (FLOMAX) 0.4 MG CAPS Take 0.4 mg by  mouth daily after supper.    . traMADol-acetaminophen (ULTRACET) 37.5-325 MG per tablet Take 1 tablet by mouth every 4 (four) hours as needed for pain.    FAMILY HISTORY:  His @FAMSTP (<SUBSCRIPT> error)@  SOCIAL HISTORY: He  reports that he has never smoked. He has never used smokeless tobacco. He reports that he does not drink alcohol or use drugs.  REVIEW OF SYSTEMS:   Unable to obtain , on vent unresponsive   SUBJECTIVE:  Unresponsive on vent   VITAL SIGNS: BP (!) 174/75   Pulse (!) 109   Temp 99.6 F (37.6 C) (Axillary)   Resp 15   Ht 5\' 10"  (1.778 m)   Wt 191 lb 5.8 oz (86.8 kg)   SpO2 99%   BMI 27.46 kg/m   HEMODYNAMICS:    VENTILATOR SETTINGS: Vent Mode: PSV;CPAP FiO2 (%):  [30 %-40 %] 30 % Set Rate:  [15 bmp] 15 bmp Vt Set:  [500 mL] 500 mL PEEP:  [5 cmH20] 5 cmH20 Pressure Support:  [5 cmH20-8 cmH20] 8 cmH20 Plateau Pressure:  [12 cmH20-17 cmH20] 12 cmH20  INTAKE / OUTPUT: I/O last 3 completed shifts: In: 5521.5 [I.V.:4011.8; NG/GT:794.7; IV Piggyback:715] Out: 2463 [Urine:2420; Drains:43]  PHYSICAL EXAMINATION: General:  No distress Neuro:  Unresponsive , not following commands , drain in place  HEENT:  ETT  Cardiovascular:  RRR  Lungs: Decreased BS in bases  Abdomen: Soft , hypoactive BS  Musculoskeletal:  intact Skin:  Intact w/o rash.  LABS:  BMET  Recent Labs Lab 07/15/2016 0446 08/02/16 0445 08/03/16 0557  NA 138 137 137  K 3.6 3.6 3.5  CL 103 107 106  CO2 24 23 23   BUN 18 18 26*  CREATININE 0.92 0.80 0.84  GLUCOSE 122* 133* 161*    Electrolytes  Recent Labs Lab 08/05/2016 0446 08/02/16 0445 08/02/16 0928 08/02/16 1724 08/03/16 0557  CALCIUM 9.2 8.6*  --   --  8.4*  MG  --   --  2.1 2.2 2.2  PHOS  --   --  3.0 2.4* 2.1*    CBC  Recent Labs Lab 08/11/2016 0446 08/02/16 0445 08/03/16 0557  WBC 12.0* 14.6* 11.8*  HGB 15.4 12.9* 12.2*  HCT 44.9 38.2* 37.4*  PLT 324 232 195    Coag's  Recent Labs Lab  07/31/16 0904  INR 1.01    Sepsis Markers  Recent Labs Lab 08/09/2016 1031 07/18/2016 1341 07/29/2016 1328 08/02/16 0445 08/03/16 0557  LATICACIDVEN 1.7 1.0  --   --   --   PROCALCITON  --   --  <0.10 0.20 0.86    ABG  Recent Labs Lab 08/01/16 1334  PHART 7.399  PCO2ART 40.0  PO2ART 227.0*    Liver Enzymes  Recent Labs Lab 07/26/2016 1029  AST 30  ALT 16*  ALKPHOS 55  BILITOT 1.5*  ALBUMIN 4.2    Cardiac Enzymes  Recent Labs Lab 08/13/2016 1029  TROPONINI <0.03    Glucose  Recent Labs Lab 08/02/16 1115 08/02/16 1517 08/02/16 1919 08/02/16 2307 08/03/16 0310 08/03/16 0910  GLUCAP 109* 105* 131* 118* 132* 124*    Imaging Dg Chest Port 1 View  Result Date: 08/03/2016 CLINICAL DATA:  Intubated. EXAM: PORTABLE CHEST 1 VIEW COMPARISON:  08/02/2016. FINDINGS: Endotracheal tube terminates approximately 2.8 cm above the carina. Nasogastric tube is followed into the stomach. Heart size stable. Thoracic aorta is calcified. Lungs are somewhat low in volume with subsegmental atelectasis at the left lung base. Small left pleural effusion. Left hemidiaphragm is elevated, as before. Advanced degenerative changes in the left shoulder. IMPRESSION: Mild volume loss at the base of the left hemi thorax with a small left pleural effusion. Electronically Signed   By: Lorin Picket M.D.   On: 08/03/2016 07:36     STUDIES:  CT head 11/6>bilateral subdural hematomas R>L , large right acute on chronic fluid w/ substantial mass effect w/ 1.4cm shift  CT >similar w/ large Right sided fluid collection   CULTURES: UC 11/16 >mult species  BC x 2 11/16 > No growth  ANTIBIOTICS: Ancef 11/18   SIGNIFICANT EVENTS: OR 11/18 for craniotomy for subdural hematoma evacuation  LINES/TUBES: 11/18 ETT >>   DISCUSSION: 82 yo male with previous CVA in 04/2016 discharged on plavix and asa admitted with bilateral R>L subdural hematomas after recurrent falls s/p craniotomy with subdural  evacuation   ASSESSMENT / PLAN:  PULMONARY A: Need for vent support -unable to protect airway due to mental state P:   Vent support  Check ABG, CXR VAP  Assess daily SBT/Wean as able   CARDIOVASCULAR A:  Hypertension  CAD   P:  Plavix/Asa on hold  Metoprolol As needed   Hydralazine As needed   Cont on nitro patch  RENAL A:    P:   Trend BMET  Replace electrolytes as indicated  Cont IVF NS at 100cc   GASTROINTESTINAL A:   NPO  P:   Tube feeds GI ppx   HEMATOLOGIC A:  No active issues  P:  Tr CBC   INFECTIOUS A:   Leukocytosis ? PNA  P:   Cont IV abx  Follow cx data  Check CXR  Tr wbc and temp   ENDOCRINE A:    P:   Monitor BS on chem, add SSI if elevated   NEUROLOGIC A:   Subdural Hematoma s/p right craniotomy w/ evacuation 11/18  P:   RASS goal: 0 to -1  Avoid oversedation  Cont keprra  NS followiing  Propofol  Fent As needed    FAMILY  - Updates: Poor prognosis for meaningful recovery. Will speak with the wife. - Inter-disciplinary family meet or Palliative Care meeting due by:  08/08/16   Critical care time- 35 mins.  Marshell Garfinkel MD [facility] Pager [phone] If no answer or after 3pm call: [phone] 08/03/2016, 10:20 AM

## 2016-08-03 NOTE — Progress Notes (Signed)
   Family unavailable.  Referred to unit/on-call chaplain.   - Rev. [place] MDiv ThM

## 2016-08-03 NOTE — Progress Notes (Signed)
Not opening eyes Some spontaneous movements but not purposeful This portends a very poor prognosis I have discussed this with the family They wish to withdraw support, I think this is a very reasonable decision

## 2016-08-03 NOTE — Progress Notes (Signed)
OT Cancellation Note  Patient Details Name: [name] MRN: [id number] DOB: [birth year]   Cancelled Treatment:    Reason Eval/Treat Not Completed: Patient not medically ready ( sedated on vent)  Parke Poisson B 08/03/2016, 11:34 AM

## 2016-08-03 NOTE — Care Management Important Message (Signed)
Important Message  Patient Details  Name: [name] MRN: [id number] Date of Birth: Aug 10, 1929   Medicare Important Message Given:  Other (see comment)    [place], Merrilee Ancona Abena 08/03/2016, 10:39 AM

## 2016-08-03 NOTE — Progress Notes (Signed)
RN reports family requesting removal of vent / full support and transition toward comfort care.   Talked with family at bedside.  Updated on process of withdrawal of care.  Discussed plan with RN.     Plan: Begin morphine gtt for comfort > goal titration for normal respiratory rate D/C propofol once morphine started  PRN Ativan for anxiety / sedation needs    Noe Gens, NP-C [facility] Pgr: [phone] or if no answer [phone] 08/03/2016, 1:02 PM

## 2016-08-04 ENCOUNTER — Encounter (HOSPITAL_COMMUNITY): Payer: Self-pay | Admitting: Neurological Surgery

## 2016-08-04 LAB — CULTURE, BLOOD (ROUTINE X 2)
CULTURE: NO GROWTH
Culture: NO GROWTH

## 2016-08-14 NOTE — Progress Notes (Signed)
   09-02-2016 0900  Clinical Encounter Type  Visited With Patient and family together  Visit Type Follow-up;Spiritual support  Referral From Physician  Consult/Referral To Chaplain  Recommendations (continue to check in with family)  Spiritual Encounters  Spiritual Needs Emotional  Stress Factors  Patient Stress Factors None identified  Family Stress Factors Exhausted;Family relationships;Loss    Chaplain followed up on [place] consult from palliative. Spoke with sisters and daughter in Sports coach. Sisters thought family would appreciate a chaplain and spiritual care, daughter in law indicated that mother and father in law had family pastor and great relationship with him and that [place] services were not needed at this time. Offered emotional support and presence of listening ear. Reinforced how reassuring a great pastor can be in this time of loss.

## 2016-08-14 NOTE — Progress Notes (Signed)
Morphine 145ml wasted in sink. Ignacia Palma, RN witnessed.

## 2016-08-14 NOTE — Progress Notes (Signed)
Nutrition Brief Note  Chart reviewed. Pt now transitioning to comfort care.  No further nutrition interventions warranted at this time.  Please re-consult as needed.   Aanvi Voyles A. Tekia Waterbury, RD, LDN, CDE Pager: [phone] After hours Pager: [phone]  

## 2016-08-14 NOTE — Progress Notes (Signed)
Witnessed waste of  Morphine 180 in sink.

## 2016-08-14 NOTE — Progress Notes (Addendum)
Pt deceased at 63. Mayra Neer, RN verified.

## 2016-08-14 DEATH — deceased

## 2016-10-15 NOTE — Discharge Summary (Signed)
Physician Discharge Summary       Patient ID: [name] MRN: [id number] DOB/AGE: 17-Mar-1929 83 y.o.  Admit date: 07/24/2016 Discharge date: 09/22/2016  Discharge Diagnoses:  Subdural Hematoma s/p right craniotomy w/ evacuation Hypertension  Coronary artery disease   Detailed Hospital Course:   [name] a 83 y.o.malewith a past medical history significant for HTN, HLD, CAD, and laryngeal cancer, presented to ER 07/20/2016 with complaints of weakness and confusion that has resulted inrecurrent falls. His first fall was three weeks ago (hit his head in the back), he then had a severeheadache over the weekend. He fell once again 11/17  sideways hitting the side of his right head (witnessed by wife), afterwhich he became more confused and unable to ambulate. Head CT revealed bilateral subdural hematomas, right greater than left. Neurology was consulted and recommended transfer to [facility] for neurosurgeon evaluation.  He was taken to OR on 11/18 for right  and PCCM assumed care.   Multiple discussions were held with the neurosurgery team and the family. He had a poor neurologic examination with very poor prognosis for recovery. The family did not want to continue aggressive care and wished to withdraw support. He was extubated to full comfort measures on 08/03/16  Significant Hospital tests/ studies STUDIES:  CT head 07/20/16 bilateral subdural hematomas R>L , large right acute on chronic fluid w/ substantial mass effect w/ 1.4cm shift  CT >similar w/ large Right sided fluid collection   CULTURES: UC 11/16 >mult species  BC x 2 11/16 > No growth  Labs at discharge Lab Results  Component Value Date   CREATININE 0.84 08/03/2016   BUN 26 (H) 08/03/2016   NA 137 08/03/2016   K 3.5 08/03/2016   CL 106 08/03/2016   CO2 23 08/03/2016   Lab Results  Component Value Date   WBC 11.8 (H) 08/03/2016   HGB 12.2 (L) 08/03/2016   HCT 37.4 (L) 08/03/2016   MCV 97.4 08/03/2016     PLT 195 08/03/2016   Lab Results  Component Value Date   ALT 16 (L) 08/01/2016   AST 30 07/16/2016   ALKPHOS 55 07/27/2016   BILITOT 1.5 (H) 08/05/2016   Lab Results  Component Value Date   INR 1.01 07/31/2016   INR 1.1 07/19/2007    Current radiology studies No results found.  Disposition:  20-Expired   Allergies as of August 27, 2016      Reactions   Ciprofloxacin Other (See Comments)   unknown   Statins    Intolerant causing myalgias      Medication List    ASK your doctor about these medications   acetaminophen 500 MG tablet Commonly known as:  TYLENOL Take 1,000 mg by mouth at bedtime as needed for moderate pain.   aspirin EC 81 MG tablet Take 81 mg by mouth daily.   clopidogrel 75 MG tablet Commonly known as:  PLAVIX Take 75 mg by mouth daily.   isosorbide dinitrate 10 MG tablet Commonly known as:  ISORDIL take 1 tablet by mouth three times a day   loratadine 10 MG tablet Commonly known as:  CLARITIN Take 10 mg by mouth daily as needed for allergies.   multivitamin with minerals tablet Take 1 tablet by mouth daily.   nitroGLYCERIN 0.4 MG SL tablet Commonly known as:  NITROSTAT Place 1 tablet under the tongue daily as needed for chest pain.   Potassium 99 MG Tabs Take 1 tablet by mouth daily.   tamsulosin 0.4 MG  Caps capsule Commonly known as:  FLOMAX Take 0.4 mg by mouth daily after supper.   traMADol-acetaminophen 37.5-325 MG tablet Commonly known as:  ULTRACET Take 1 tablet by mouth every 4 (four) hours as needed for pain.       Discharged Condition: Deceased  Signed: Othello Sgroi 09/22/2016, 4:05 PM

## 2018-05-15 IMAGING — CT CT HEAD W/O CM
3 series · 14 of 47 positions shown, 16 images · non-contrast
Comparison: MRI from [DATE]/ 9705

CLINICAL DATA: Generalized weakness.  Poor historian.

EXAM:
CT HEAD WITHOUT CONTRAST
TECHNIQUE: Contiguous axial images were obtained from the base of the skull
through the vertex without intravenous contrast.

[Series 2: head wo · axial · 0.45mm/px · z∈[+82,+217]mm · 8 of 33 slices shown, 10 images]
[im 3/33  brain]
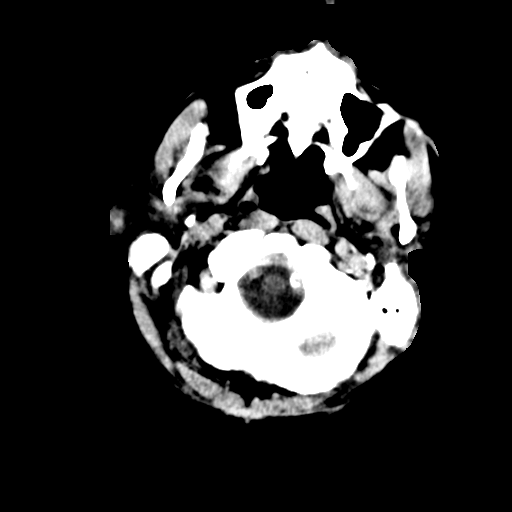
[im 3/33  bone]
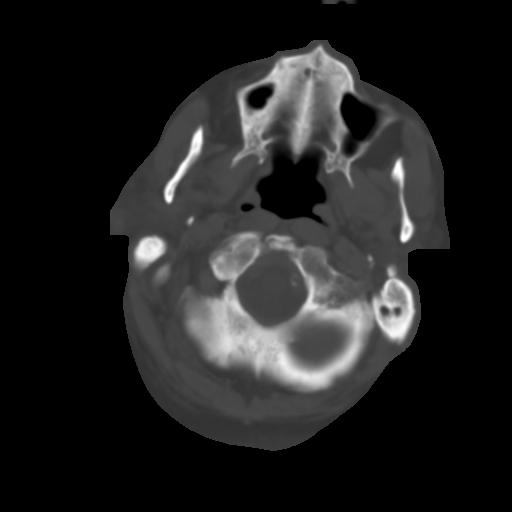
[im 7/33  brain]
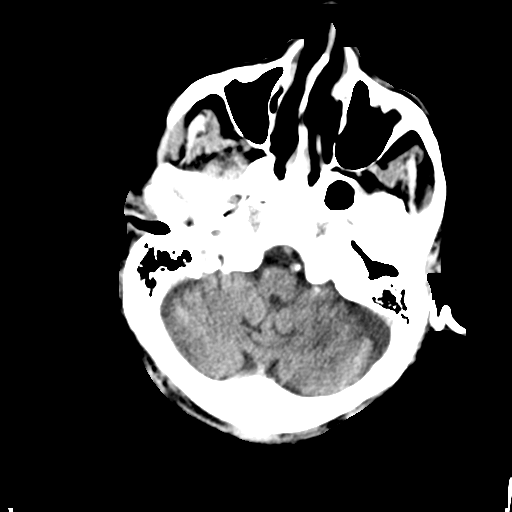
[im 10/33  brain]
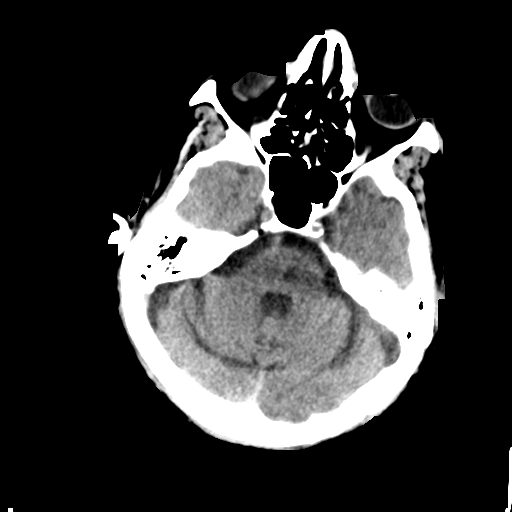
[im 15/33  brain]
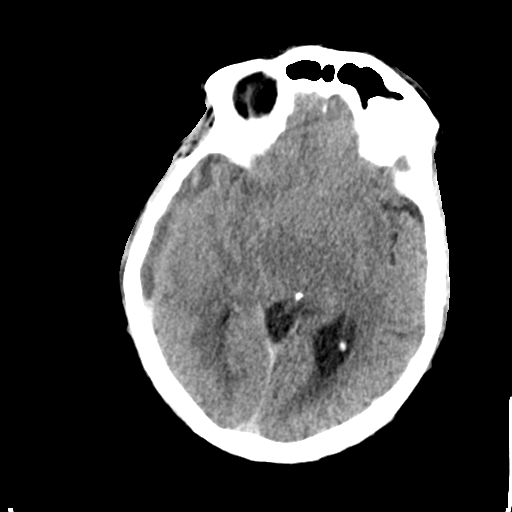
[im 18/33  brain]
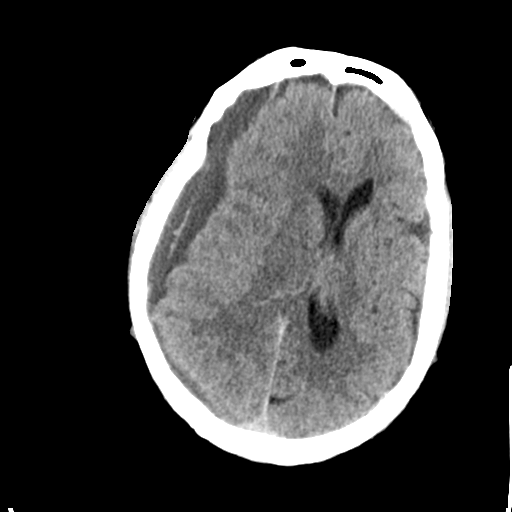
[im 18/33  bone]
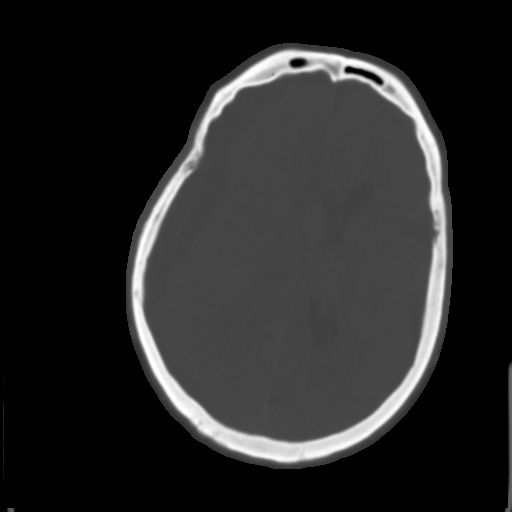
[im 23/33  brain]
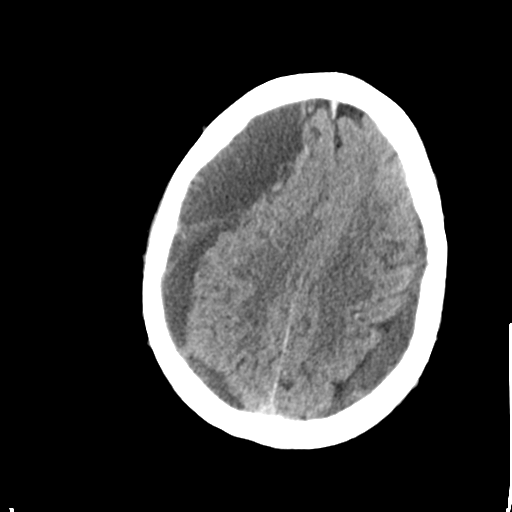
[im 26/33  brain]
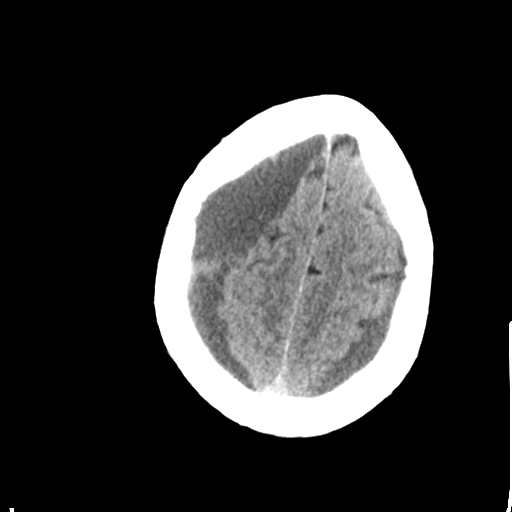
[im 30/33  brain]
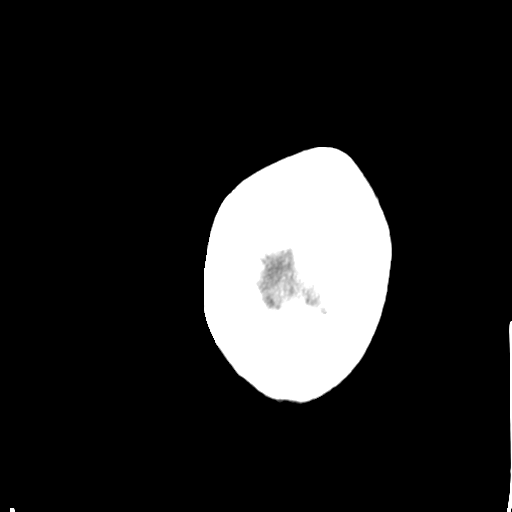

[Series 4: coronal soft tissue · coronal · 0.34mm/px · 3 of 71 slices shown]
[im 24/71  brain]
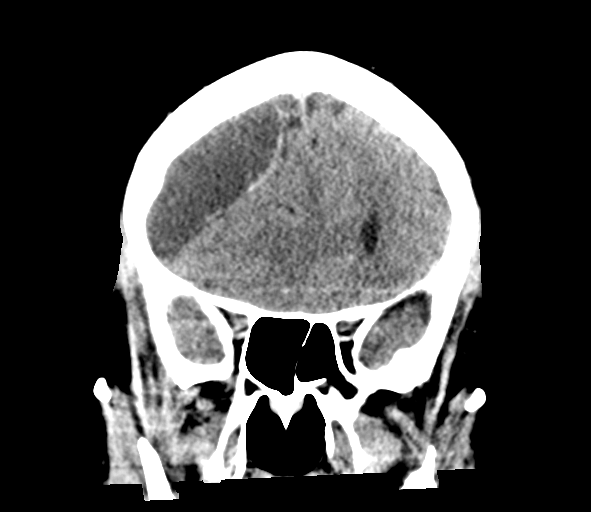
[im 32/71  brain]
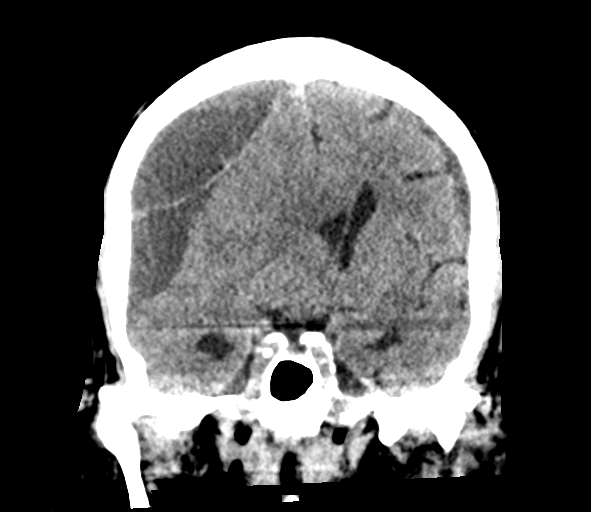
[im 39/71  brain]
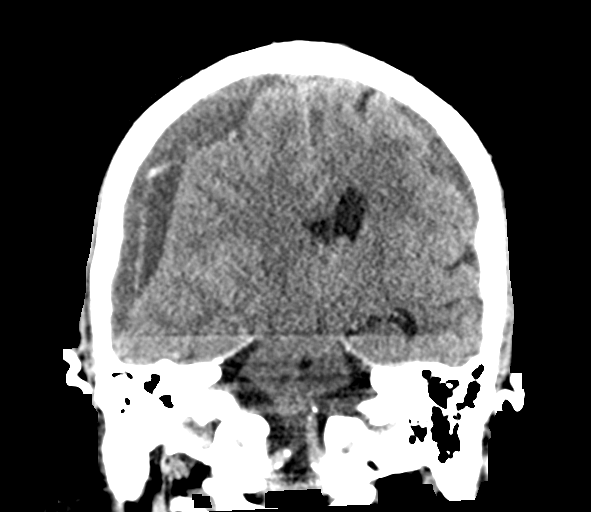

[Series 5: sagittal soft tissue · sagittal · 0.37mm/px · 3 of 64 slices shown]
[im 23/64  brain]
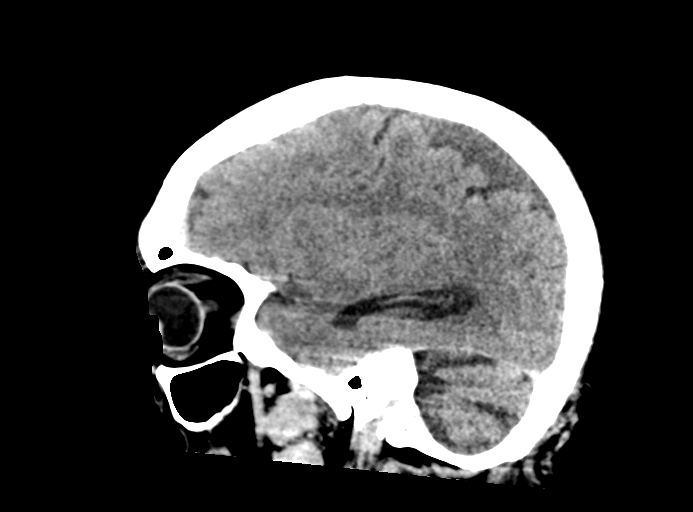
[im 32/64  brain]
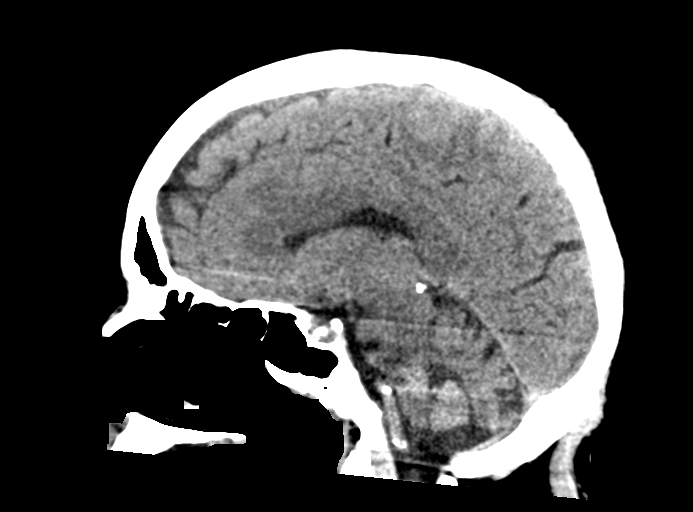
[im 42/64  brain]
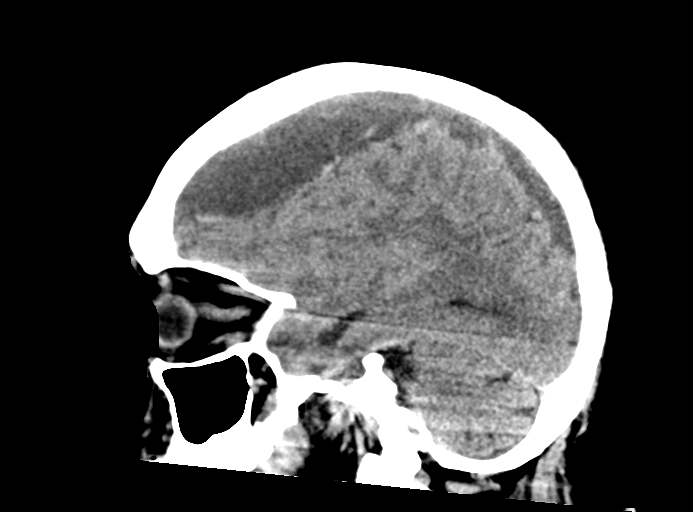

[14 of 47 positions shown; findings below may reference images not displayed]

FINDINGS: Brain: Bilateral acute on chronic subdural hematomas noted. Right
subdural is mainly frontal parietal and is larger measuring up to
about 2.5 cm thickness in the frontal region. This generates 1.4 cm
right to left anterior subfalcine midline shift and generates
substantial mass-effect on the lateral ventricle. More acute blood
products are seen layering dependently in the extra-axial fluid
collection

Left subdural is high parietal in location and measures up to about
1.1 cm and thickness. Left-sided extra-axial fluid collection is
mainly low attenuation suggesting chronicity, but is new since
05/22/2016.

Lacunar infarcts are now visible in the pons, at the location of the
acute ischemia seen on the previous MRI. Diffuse loss of parenchymal
volume is consistent with atrophy. Patchy low attenuation in the
deep hemispheric and periventricular white matter is nonspecific,
but likely reflects chronic microvascular ischemic demyelination.

Vascular: Atherosclerotic calcification is visualized in the carotid
arteries. No dense MCA sign. Major dural sinuses are unremarkable.
Dense atherosclerotic calcification noted in the vertebrobasilar
system.

Skull: No evidence for fracture. No worrisome lytic or sclerotic
lesion.

Sinuses/Orbits: The visualized paranasal sinuses and mastoid air
cells are clear. Visualized portions of the globes and intraorbital
fat are unremarkable.

Other: Insert 9
IMPRESSION: Bilateral subdural hematomas, right greater than left. The large
right acute on chronic extra-axial fluid collection generates
substantial mass-effect with 1.4 cm right to left subfalcine midline
shift.

Critical Value/emergent results were called by me at the time of
interpretation on 07/30/2016 at [DATE] to Dr. LOVENA NATACHA DUMEL ,
who verbally acknowledged these results.
